# Patient Record
Sex: Male | Born: 1975 | Race: White | Hispanic: No | Marital: Married | Smoking: Former smoker
Health system: Southern US, Community
[De-identification: ages and names within clinical notes are randomized; demographics above are authoritative.]

## PROBLEM LIST (undated history)

## (undated) DIAGNOSIS — I10 Essential (primary) hypertension: Secondary | ICD-10-CM

## (undated) HISTORY — PX: MOUTH SURGERY: SHX715

## (undated) HISTORY — DX: Essential (primary) hypertension: I10

## (undated) HISTORY — PX: SKIN GRAFT: SHX250

---

## 2013-06-09 ENCOUNTER — Emergency Department (HOSPITAL_BASED_OUTPATIENT_CLINIC_OR_DEPARTMENT_OTHER)
Admission: EM | Admit: 2013-06-09 | Discharge: 2013-06-09 | Disposition: A | Discharge status: Home / Self Care | Payer: BC Managed Care – PPO | Attending: Emergency Medicine | Admitting: Emergency Medicine

## 2013-06-09 ENCOUNTER — Emergency Department (HOSPITAL_BASED_OUTPATIENT_CLINIC_OR_DEPARTMENT_OTHER): Payer: BC Managed Care – PPO

## 2013-06-09 ENCOUNTER — Encounter (HOSPITAL_BASED_OUTPATIENT_CLINIC_OR_DEPARTMENT_OTHER): Payer: Self-pay

## 2013-06-09 DIAGNOSIS — R519 Headache, unspecified: Secondary | ICD-10-CM

## 2013-06-09 DIAGNOSIS — Z87891 Personal history of nicotine dependence: Secondary | ICD-10-CM | POA: Insufficient documentation

## 2013-06-09 DIAGNOSIS — S139XXA Sprain of joints and ligaments of unspecified parts of neck, initial encounter: Secondary | ICD-10-CM | POA: Insufficient documentation

## 2013-06-09 DIAGNOSIS — Y939 Activity, unspecified: Secondary | ICD-10-CM | POA: Insufficient documentation

## 2013-06-09 DIAGNOSIS — R42 Dizziness and giddiness: Secondary | ICD-10-CM | POA: Insufficient documentation

## 2013-06-09 DIAGNOSIS — S161XXA Strain of muscle, fascia and tendon at neck level, initial encounter: Secondary | ICD-10-CM

## 2013-06-09 DIAGNOSIS — X58XXXA Exposure to other specified factors, initial encounter: Secondary | ICD-10-CM | POA: Insufficient documentation

## 2013-06-09 DIAGNOSIS — Y929 Unspecified place or not applicable: Secondary | ICD-10-CM | POA: Insufficient documentation

## 2013-06-09 DIAGNOSIS — R51 Headache: Secondary | ICD-10-CM | POA: Insufficient documentation

## 2013-06-09 MED ORDER — OXYCODONE-ACETAMINOPHEN 5-325 MG PO TABS: 2.0000 | ORAL_TABLET | ORAL | Status: DC | PRN

## 2013-06-09 NOTE — ED Provider Notes (Signed)
CSN: [id number]     Arrival date & time 06/09/13  1143 History     First MD Initiated Contact with Patient 06/09/13 1245     Chief Complaint  Patient presents with  . Neck Pain  . Headache   (Consider location/radiation/quality/duration/timing/severity/associated sxs/prior Treatment) HPI Comments: Patient presents with intermittent headache and neck pain. He states that a week ago he was writing a lower posterior and when he got off the Road history he had a left-sided headache with associated dizziness. That subsided. Since that time he had intermittent sharp pains to the left side of his head. The pain generally lasts from anywhere from 20 minutes to an hour. There is no associated dizziness nausea or vomiting. He has no vision changes. There is no numbness or weakness in his extremities. He denies any balance issues. He currently denies any headache. He states sometimes it comes on when he's doing. He also started having some pain in the left side his neck about 3 days ago. He states it's not hurting him now but it seems to hurt when he turns his head toward the left. He denies any radiation down his arm or numbness or weakness in his hand.  Patient is a 37 y.o. male presenting with neck pain and headaches.  Neck Pain Associated symptoms: headaches   Associated symptoms: no chest pain, no fever, no numbness and no weakness   Headache Associated symptoms: neck pain   Associated symptoms: no abdominal pain, no back pain, no congestion, no cough, no diarrhea, no dizziness, no fatigue, no fever, no nausea, no numbness and no vomiting     History reviewed. No pertinent past medical history. Past Surgical History  Procedure Laterality Date  . Skin graft      Injured right hand resulting in surgery and skin graft   History reviewed. No pertinent family history. History  Substance Use Topics  . Smoking status: Former Games developer  . Smokeless tobacco: Not on file  . Alcohol Use: No    Review  of Systems  Constitutional: Negative for fever, chills, diaphoresis and fatigue.  HENT: Positive for neck pain. Negative for congestion, rhinorrhea and sneezing.   Eyes: Negative.   Respiratory: Negative for cough, chest tightness and shortness of breath.   Cardiovascular: Negative for chest pain and leg swelling.  Gastrointestinal: Negative for nausea, vomiting, abdominal pain, diarrhea and blood in stool.  Genitourinary: Negative for frequency, hematuria, flank pain and difficulty urinating.  Musculoskeletal: Negative for back pain and arthralgias.  Skin: Negative for rash.  Neurological: Positive for headaches. Negative for dizziness, speech difficulty, weakness and numbness.    Allergies  Review of patient's allergies indicates no known allergies.  Home Medications  No current outpatient prescriptions on file. BP 129/76  Pulse 57  Temp(Src) 98.3 F (36.8 C) (Oral)  Resp 18  Ht 5\' 9"  (1.753 m)  Wt 180 lb (81.647 kg)  BMI 26.57 kg/m2  SpO2 98% Physical Exam  Constitutional: He is oriented to person, place, and time. He appears well-developed and well-nourished.  HENT:  Head: Normocephalic and atraumatic.  No tenderness on palpation temporal artery  Eyes: Pupils are equal, round, and reactive to light.  Neck: Normal range of motion. Neck supple.  Mild tenderness to the left paraspinal muscles in the left mid cervical region. There is no step-offs or deformities.  Cardiovascular: Normal rate, regular rhythm and normal heart sounds.   Pulmonary/Chest: Effort normal and breath sounds normal. No respiratory distress. He has no wheezes. He  has no rales. He exhibits no tenderness.  Abdominal: Soft. Bowel sounds are normal. There is no tenderness. There is no rebound and no guarding.  Musculoskeletal: Normal range of motion. He exhibits no edema.  Lymphadenopathy:    He has no cervical adenopathy.  Neurological: He is alert and oriented to person, place, and time. He has normal  strength. No cranial nerve deficit or sensory deficit. GCS eye subscore is 4. GCS verbal subscore is 5. GCS motor subscore is 6.  Skin: Skin is warm and dry. No rash noted.  Psychiatric: He has a normal mood and affect.    ED Course   Procedures (including critical care time)  Labs Reviewed - No data to display Dg Cervical Spine Complete  06/09/2013   *RADIOLOGY REPORT*  Clinical Data: Neck pain and headache  CERVICAL SPINE - COMPLETE 4+ VIEW  Comparison: None.  Findings: Frontal, lateral, open mouth odontoid, and bilateral oblique views were obtained.  There is no fracture or spondylolisthesis.  Prevertebral soft tissues and predental space regions are normal.  Disc spaces appear intact.  There is no appreciable facet arthropathy on the oblique views.  IMPRESSION: No fracture or appreciable arthropathic change.   Original Report Authenticated By: Bretta Bang, M.D.   1. Neck strain, initial encounter   2. Headache     MDM  Patient intermittent left-sided pain. He also has some tenderness to his left neck. There is no bony injury. He's neurologically intact. His gait is normal. He currently has no headache. Given that he currently has no symptoms I don't feel this is suggestive of the intracranial hemorrhage. He has no symptoms suggestive of CVA. Given the history of the posterior right I would consider carotid dissection however he currently has no symptoms. I will give him a referral to followup with neurology. I feel if his symptoms continue he may need MRI and MRA. I did not feel that he needs an urgent transfer for this today given that he doesn't have any symptoms now. Advised to return here for symptoms worsen.  Rolan Bucco, MD 06/09/13 [phone]

## 2013-06-09 NOTE — ED Notes (Addendum)
Pt states that he has had a sharp pain in his right temple for around a week now.   Pt reports the neck pain and stiffness started this morning.  Pt denies any injury but did ride roller coasters last Sat which coincides with the start of the headache.  Pt in NAD, AAOx4.

## 2014-10-23 ENCOUNTER — Emergency Department (HOSPITAL_BASED_OUTPATIENT_CLINIC_OR_DEPARTMENT_OTHER)
Admission: EM | Admit: 2014-10-23 | Discharge: 2014-10-23 | Disposition: A | Discharge status: Home / Self Care | Payer: BC Managed Care – PPO | Attending: Emergency Medicine | Admitting: Emergency Medicine

## 2014-10-23 ENCOUNTER — Encounter (HOSPITAL_BASED_OUTPATIENT_CLINIC_OR_DEPARTMENT_OTHER): Payer: Self-pay | Admitting: Family Medicine

## 2014-10-23 ENCOUNTER — Emergency Department (HOSPITAL_BASED_OUTPATIENT_CLINIC_OR_DEPARTMENT_OTHER): Payer: BC Managed Care – PPO

## 2014-10-23 DIAGNOSIS — R1110 Vomiting, unspecified: Secondary | ICD-10-CM

## 2014-10-23 DIAGNOSIS — R0981 Nasal congestion: Secondary | ICD-10-CM | POA: Diagnosis not present

## 2014-10-23 DIAGNOSIS — R51 Headache: Secondary | ICD-10-CM | POA: Diagnosis not present

## 2014-10-23 DIAGNOSIS — Z87891 Personal history of nicotine dependence: Secondary | ICD-10-CM | POA: Insufficient documentation

## 2014-10-23 DIAGNOSIS — K920 Hematemesis: Secondary | ICD-10-CM | POA: Diagnosis not present

## 2014-10-23 DIAGNOSIS — R61 Generalized hyperhidrosis: Secondary | ICD-10-CM | POA: Insufficient documentation

## 2014-10-23 DIAGNOSIS — R519 Headache, unspecified: Secondary | ICD-10-CM

## 2014-10-23 LAB — CBC WITH DIFFERENTIAL/PLATELET
BASOS ABS: 0 10*3/uL (ref 0.0–0.1)
BASOS PCT: 0 % (ref 0–1)
EOS ABS: 0.3 10*3/uL (ref 0.0–0.7)
Eosinophils Relative: 3 % (ref 0–5)
HCT: 48 % (ref 39.0–52.0)
HEMOGLOBIN: 17.7 g/dL — AB (ref 13.0–17.0)
Lymphocytes Relative: 20 % (ref 12–46)
Lymphs Abs: 1.5 10*3/uL (ref 0.7–4.0)
MCH: 34.3 pg — AB (ref 26.0–34.0)
MCHC: 36.9 g/dL — AB (ref 30.0–36.0)
MCV: 93 fL (ref 78.0–100.0)
MONO ABS: 0.7 10*3/uL (ref 0.1–1.0)
MONOS PCT: 9 % (ref 3–12)
NEUTROS ABS: 5.3 10*3/uL (ref 1.7–7.7)
NEUTROS PCT: 68 % (ref 43–77)
Platelets: 298 10*3/uL (ref 150–400)
RBC: 5.16 MIL/uL (ref 4.22–5.81)
RDW: 12.1 % (ref 11.5–15.5)
WBC: 7.7 10*3/uL (ref 4.0–10.5)

## 2014-10-23 LAB — COMPREHENSIVE METABOLIC PANEL
ALBUMIN: 4 g/dL (ref 3.5–5.2)
ALK PHOS: 99 U/L (ref 39–117)
ALT: 38 U/L (ref 0–53)
ANION GAP: 12 (ref 5–15)
AST: 29 U/L (ref 0–37)
BILIRUBIN TOTAL: 0.5 mg/dL (ref 0.3–1.2)
BUN: 13 mg/dL (ref 6–23)
CHLORIDE: 103 meq/L (ref 96–112)
CO2: 24 meq/L (ref 19–32)
CREATININE: 1 mg/dL (ref 0.50–1.35)
Calcium: 9 mg/dL (ref 8.4–10.5)
GFR calc Af Amer: >90 mL/min (ref >90)
GLUCOSE: 111 mg/dL — AB (ref 70–99)
POTASSIUM: 4.3 meq/L (ref 3.7–5.3)
Sodium: 139 mEq/L (ref 137–147)
Total Protein: 8.1 g/dL (ref 6.0–8.3)

## 2014-10-23 LAB — LIPASE, BLOOD: LIPASE: 220 U/L — AB (ref 11–59)

## 2014-10-23 MED ORDER — SODIUM CHLORIDE 0.9 % IV BOLUS (SEPSIS)
500.0000 mL | Freq: Once | INTRAVENOUS | Status: AC
  Administered 2014-10-23: 500 mL via INTRAVENOUS

## 2014-10-23 MED ORDER — PROMETHAZINE HCL 25 MG PO TABS
25.0000 mg | ORAL_TABLET | Freq: Four times a day (QID) | ORAL | Status: DC | PRN
Start: 2014-10-23 — End: 2018-08-31

## 2014-10-23 MED ORDER — HYDROCODONE-ACETAMINOPHEN 5-325 MG PO TABS
1.0000 | ORAL_TABLET | Freq: Four times a day (QID) | ORAL | Status: DC | PRN
Start: 2014-10-23 — End: 2018-08-31

## 2014-10-23 MED ORDER — IOHEXOL 300 MG/ML  SOLN
100.0000 mL | Freq: Once | INTRAMUSCULAR | Status: AC | PRN
  Administered 2014-10-23: 100 mL via INTRAVENOUS

## 2014-10-23 MED ORDER — FAMOTIDINE 20 MG PO TABS: 20.0000 mg | ORAL_TABLET | Freq: Two times a day (BID) | ORAL | Status: AC

## 2014-10-23 MED ORDER — PANTOPRAZOLE SODIUM 40 MG IV SOLR
40.0000 mg | Freq: Once | INTRAVENOUS | Status: AC
  Administered 2014-10-23: 40 mg via INTRAVENOUS
  Filled 2014-10-23: qty 40

## 2014-10-23 MED ORDER — SODIUM CHLORIDE 0.9 % IV SOLN
INTRAVENOUS | Status: DC
  Administered 2014-10-23: 09:00:00 via INTRAVENOUS

## 2014-10-23 MED ORDER — IOHEXOL 300 MG/ML  SOLN
25.0000 mL | Freq: Once | INTRAMUSCULAR | Status: AC | PRN
  Administered 2014-10-23: 25 mL via ORAL

## 2014-10-23 MED ORDER — ONDANSETRON HCL 4 MG/2ML IJ SOLN
4.0000 mg | Freq: Once | INTRAMUSCULAR | Status: AC
  Administered 2014-10-23: 4 mg via INTRAVENOUS
  Filled 2014-10-23: qty 2

## 2014-10-23 NOTE — Discharge Instructions (Signed)
Recommend clear liquid diet for the next 24 hours based on the elevated lipase. Take the Pepcid as directed for the next 2 weeks. Take the Phenergan as needed to prevent vomiting. Take the hydrocodone as needed for any headache pain. Resource guide provided below to help you find a record Dr. Bonita QuinYou will need to follow-up and have the lipase rechecked to make sure it goes back to normal.  In addition is important to return for any further vomiting of blood.    Emergency Department Resource Guide 1) Find a Doctor and Pay Out of Pocket Although you won't have to find out who is covered by your insurance plan, it is a good idea to ask around and get recommendations. You will then need to call the office and see if the doctor you have chosen will accept you as a new patient and what types of options they offer for patients who are self-pay. Some doctors offer discounts or will set up payment plans for their patients who do not have insurance, but you will need to ask so you aren't surprised when you get to your appointment.  2) Contact Your Local Health Department Not all health departments have doctors that can see patients for sick visits, but many do, so it is worth a call to see if yours does. If you don't know where your local health department is, you can check in your phone book. The CDC also has a tool to help you locate your state's health department, and many state websites also have listings of all of their local health departments.  3) Find a Walk-in Clinic If your illness is not likely to be very severe or complicated, you may want to try a walk in clinic. These are popping up all over the country in pharmacies, drugstores, and shopping centers. They're usually staffed by nurse practitioners or physician assistants that have been trained to treat common illnesses and complaints. They're usually fairly quick and inexpensive. However, if you have serious medical issues or chronic medical problems,  these are probably not your best option.  No Primary Care Doctor: - Call Health Connect at  [phone] - they can help you locate a primary care doctor that  accepts your insurance, provides certain services, etc. - Physician Referral Service- [phone]  Chronic Pain Problems: Organization         Address  Phone   Notes  [facility]  [phone] Patients need to be referred by their primary care doctor.   Medication Assistance: Organization         Address  Phone   Notes  [facility] [place] [facility] [address] [place], [place] [address] [phone] --Must be a resident of [facility] -- Must have NO insurance coverage whatsoever (no Medicaid/ Medicare, etc.) -- The pt. MUST have a primary care doctor that directs their care regularly and follows them in the community   MedAssist  [phone]   [employer]  [phone]) [phone]    Agencies that provide inexpensive medical care: Organization         Address  Phone   Notes  [facility]  [phone]) [phone]   [facility]    [phone]   [facility] [address] [place], [place] [address] [phone]   [facility] [address], [place] [phone]   [facility]anned Parenthood    [phone]  [facility]    [phone]   [facility] and [facility]  [address], [place] Phone:  [phone], Fax:  [phone] Hours of Operation:  9 am - 6 pm, M-F.  Also accepts Medicaid/Medicare and self-pay.  [facility]  [address], [place] Phone: [phone], Fax: [phone]. Hours of Operation:  8:30 am - 5:30 pm, M-F.  Also accepts Medicaid and self-pay.  [facility] [place] [address] [place] Phone: [phone]   [facility] [address] Natasha Bence [place], [place] [phone], Ext. [phone] Mondays &  Thursdays: 7-9 AM.  First 15 patients are seen on a first come, first serve basis.    Medicaid-accepting [facility] Providers:  Organization         Address  Phone   Notes  [facility] [address], [place] [phone] Also accepts self-pay patients.  [facility] [address] Laurell Josephs [place], [place]  [phone]   [facility] [address], [place] [phone]   [facility] [address], [place] [phone]   Renaye Rakers [address], [place]   [phone] Only accepts [place] Access [place] patients after they have their name applied to their card.   Self-Pay (no insurance) in [place]:  Organization         Address  Phone   Notes  Sickle Cell Patients, [facility] [address] [place], [place] [phone]   [facility] [address] [place], [place] [phone]   [facility]  [address], [place] [phone]   [facility]/Dr. Osei-Bonsu  [address] [place] or [address], [place] [phone] Phone number for both [place] and [place] locations is the same.  Urgent Medical and [facility] [address] [place] [phone]   [facility] [address], [place] or [address] [phone]   [facility] [address], [place] [phone], phone; [phone], fax Sees patients 1st and 3rd Saturday of every month.  Must not qualify for public or private insurance (i.e. Medicaid, Medicare, [place] Health Choice, Veterans' Benefits)  Household income should be no more than 200% of the poverty level The clinic cannot treat you if you are pregnant or think you are pregnant  Sexually transmitted diseases are not treated at the  clinic.    Dental Care: Organization         Address  Phone  Notes  [facility] Department of [facility] [address] [place], [place] [phone] Accepts children up to age 64 who are enrolled in [place] or [place] Health Choice; pregnant women with a Medicaid card; and children who have applied for Medicaid or [place] Health Choice, but were declined, whose parents can pay a reduced fee at time of service.  [facility] Department of [facility]  [address], [place] [phone] Accepts children up to age 89 who are enrolled in [place] or [place] Health Choice; pregnant women with a Medicaid card; and children who have applied for Medicaid or [place] Health Choice, but were declined, whose parents can pay a reduced fee at time  of service.  [facility]  [address], [place] [phone] Patients are seen by appointment only. Walk-ins are not accepted. [facility] will see patients 10518 years of age and older. Monday - Tuesday (8am-5pm) Most Wednesdays (8:30-5pm) $30 per visit, cash only  [facility]  [address], [facility] [phone] Patients are seen by appointment only. Walk-ins are not accepted. [facility] will see patients 40 years of age and older. One Wednesday Evening (Monthly: Volunteer Based).  $30 per visit, cash only  [employer] [facility] [facility]  [phone] for adults; Children under age 614, call Graduate Pediatric Dentistry at [phone]. Children aged 634-14, please call [phone]) [phone] to request a pediatric application.  Dental services are provided in all areas of dental care including fillings, crowns and bridges, complete and partial dentures, implants, gum treatment, root canals, and extractions. Preventive care is also provided. Treatment is provided to both adults and children. Patients are selected via a lottery and there is often a  waiting list.   [facility] [address], [place]  [phone] [email or web address]   [facility] [address], [place], [place] [phone], Ext. [phone] Second and Fourth Thursday of each month, opens at 6:30 AM; Clinic ends at 9 AM.  Patients are seen on a first-come first-served basis, and a limited number are seen during each clinic.   [facility]  [address], [place], [place] [phone]   Eligibility Requirements You must have lived in [place], [place], or [place] counties for at least the last three months.   You cannot be eligible for state or federal sponsored National Cityhealthcare insurance, including CIGNAVeterans Administration, [place]icaid, or [employer]Medicare.   You generally cannot be eligible for healthcare insurance through your employer.    How to apply: Eligibility screenings are held every Tuesday and Wednesday afternoon from 1:00 pm until 4:00 pm. You do not need an appointment for the interview!  [facility] [address], [place], [place] [phone]   [facility]  [phone]   [facility]  [phone]   [facility]  [phone]    Behavioral Health Resources in the Community: Intensive Outpatient Programs Organization         Address  Phone  Notes  [facility] [address], [place], [place] [phone]   [facility] [address], [place], [place] [phone]   [facility] [address], [place], [place]  [phone]   [facility] [address],  [place], [place] [phone] or [phone]   Substance Abuse Resources Organization         Address  Phone  Notes  Al[facility] Drug Services  [phone]   [facility]  [phone]   [facility]  [phone]   [facility]  [phone]   Residential & Outpatient Substance Abuse  Program  [phone]   Psychological Services Organization         Address  Phone  Notes  [facility]  [phone]   [facility]  [phone]- [phone]   [facility] [address], [place] [phone] or [phone]    Mobile Crisis Teams Organization         Address  Phone  Notes  [facility], Mobile Crisis Care Unit  [phone]   Assertive Psychotherapeutic  Services  [address] [place], [place]   [facility] [address], [place] [phone]    Self-Help/Support Groups Organization         Address  Phone             Notes  [place]. of [place] - variety of support groups  [place] Call for more information  Narcotics Anonymous (NA), [facility]aring [facility] [address], [employer] [place]  2 meetings at this location   Special educational needs teacher         Address  Phone  Notes  ASAP Residential Treatment [place],    [place]  [phone]   [facility]  [address] [place] [address]48016, [place], [place]   [place], [place] [phone] Admissions: 8am-3pm M-F  Incentives Substance [place] [address]    [place], [place] [phone]   [facility] [address] [place], [place], [place]   [facility] [address],  [place], [place]   [facility] Programs - Intensive Outpatient [address] Kristeen Mans [address], [place], [place]   [facility].,  [place], [place] [phone] or [phone]   [facility] (RTS) [address] [place], [place] Accepts Medicaid  [facility] [place] [address],  [place] [phone] Substance Abuse/Addiction Treatment   [facility] Organization          Address  Phone  Notes  [facility]  [phone]   Domenic Schwab, PhD [address] Arlis [place], [place]   [phone] or [phone]   [place] [address] [place], [place] [phone]   [facility] [address], [place], [place] [phone] Insurance/Medicaid/sponsorship through [facility] [address] [place]                                    [place], [place] [phone] [place] [address], [place] [phone]    Dr. Adele Schilder  [phone]   [facility]. 1) [address] [place] 2) [place] 3)  [place] [address], [place] [phone]  [phone]   [place] [phone] or [phone] (After Hours)

## 2014-10-23 NOTE — ED Provider Notes (Addendum)
CSN: [id number]     Arrival date & time 10/23/14  [id number] History   First MD Initiated Contact with Patient 10/23/14 [phone]     Chief Complaint  Patient presents with  . Facial Pain  . Nasal Congestion     (Consider location/radiation/quality/duration/timing/severity/associated sxs/prior Treatment) The history is provided by the patient.   patient presents with 2 complaints. Patient's had some long-standing intermittent headaches for the past several months that he felt were related to sinus infections. Patient's been taking Alka-Seltzer plus on a regular basis. Today about 20 minutes after taking the Alka-Seltzer he vomited 2 pools of blood.  Patient denies any lightheadedness or feeling like she's got pass out chest pain or shortness of breath. Does admit to pressure in the face. History reviewed. No pertinent past medical history. Past Surgical History  Procedure Laterality Date  . Skin graft      Injured right hand resulting in surgery and skin graft   No family history on file. History  Substance Use Topics  . Smoking status: Former Games developermoker  . Smokeless tobacco: Not on file  . Alcohol Use: No    Review of Systems  Constitutional: Positive for fever and diaphoresis.  HENT: Positive for congestion and sinus pressure.   Eyes: Negative for visual disturbance.  Respiratory: Negative for shortness of breath.   Cardiovascular: Negative for chest pain.  Gastrointestinal: Positive for nausea and vomiting. Negative for abdominal pain and blood in stool.  Genitourinary: Negative for dysuria.  Musculoskeletal: Negative for back pain.  Skin: Negative for rash.  Neurological: Negative for headaches.  Hematological: Does not bruise/bleed easily.  Psychiatric/Behavioral: Negative for confusion.      Allergies  Review of patient's allergies indicates no known allergies.  Home Medications   Prior to Admission medications   Medication Sig Start Date End Date Taking? Authorizing  Provider  oxyCODONE-acetaminophen (PERCOCET) 5-325 MG per tablet Take 2 tablets by mouth every 4 (four) hours as needed for pain. 06/09/13   Rolan BuccoMelanie Belfi, MD   BP 131/72 mmHg  Pulse 68  Temp(Src) 98.6 F (37 C) (Oral)  Resp 16  Ht 5\' 9"  (1.753 m)  Wt 195 lb (88.451 kg)  BMI 28.78 kg/m2  SpO2 97% Physical Exam  Constitutional: He is oriented to person, place, and time. He appears well-developed and well-nourished. No distress.  HENT:  Head: Normocephalic and atraumatic.  Mouth/Throat: Oropharynx is clear and moist. No oropharyngeal exudate.  Eyes: Conjunctivae and EOM are normal. Pupils are equal, round, and reactive to light.  Neck: Normal range of motion.  Cardiovascular: Normal rate, regular rhythm and normal heart sounds.   No murmur heard. Pulmonary/Chest: Effort normal and breath sounds normal. No respiratory distress.  Abdominal: Soft. Bowel sounds are normal. He exhibits no distension.  Musculoskeletal: Normal range of motion.  Neurological: He is alert and oriented to person, place, and time. No cranial nerve deficit. He exhibits normal muscle tone. Coordination normal.  Skin: Skin is warm. No rash noted.  Nursing note and vitals reviewed.   ED Course  Procedures (including critical care time) Labs Review Labs Reviewed  COMPREHENSIVE METABOLIC PANEL - Abnormal; Notable for the following:    Glucose, Bld 111 (*)    All other components within normal limits  LIPASE, BLOOD - Abnormal; Notable for the following:    Lipase 220 (*)    All other components within normal limits  CBC WITH DIFFERENTIAL - Abnormal; Notable for the following:    Hemoglobin 17.7 (*)  MCH 34.3 (*)    MCHC 36.9 (*)    All other components within normal limits   Results for orders placed or performed during the hospital encounter of 10/23/14  Comprehensive metabolic panel  Result Value Ref Range   Sodium 139 137 - 147 mEq/L   Potassium 4.3 3.7 - 5.3 mEq/L   Chloride 103 96 - 112 mEq/L    CO2 24 19 - 32 mEq/L   Glucose, Bld 111 (H) 70 - 99 mg/dL   BUN 13 6 - 23 mg/dL   Creatinine, Ser 1.61 0.50 - 1.35 mg/dL   Calcium 9.0 8.4 - 09.6 mg/dL   Total Protein 8.1 6.0 - 8.3 g/dL   Albumin 4.0 3.5 - 5.2 g/dL   AST 29 0 - 37 U/L   ALT 38 0 - 53 U/L   Alkaline Phosphatase 99 39 - 117 U/L   Total Bilirubin 0.5 0.3 - 1.2 mg/dL   GFR calc non Af Amer >90 >90 mL/min   GFR calc Af Amer >90 >90 mL/min   Anion gap 12 5 - 15  Lipase, blood  Result Value Ref Range   Lipase 220 (H) 11 - 59 U/L  CBC with Differential  Result Value Ref Range   WBC 7.7 4.0 - 10.5 K/uL   RBC 5.16 4.22 - 5.81 MIL/uL   Hemoglobin 17.7 (H) 13.0 - 17.0 g/dL   HCT 04.5 40.9 - 81.1 %   MCV 93.0 78.0 - 100.0 fL   MCH 34.3 (H) 26.0 - 34.0 pg   MCHC 36.9 (H) 30.0 - 36.0 g/dL   RDW 91.4 78.2 - 95.6 %   Platelets 298 150 - 400 K/uL   Neutrophils Relative % 68 43 - 77 %   Neutro Abs 5.3 1.7 - 7.7 K/uL   Lymphocytes Relative 20 12 - 46 %   Lymphs Abs 1.5 0.7 - 4.0 K/uL   Monocytes Relative 9 3 - 12 %   Monocytes Absolute 0.7 0.1 - 1.0 K/uL   Eosinophils Relative 3 0 - 5 %   Eosinophils Absolute 0.3 0.0 - 0.7 K/uL   Basophils Relative 0 0 - 1 %   Basophils Absolute 0.0 0.0 - 0.1 K/uL    Imaging Review Ct Head Wo Contrast  10/23/2014   CLINICAL DATA:  Headache, sinus pressure  EXAM: CT HEAD WITHOUT CONTRAST  TECHNIQUE: Contiguous axial images were obtained from the base of the skull through the vertex without intravenous contrast.  COMPARISON:  None.  FINDINGS: No skull fracture is noted. Paranasal sinuses and mastoid air cells are unremarkable.  No intracranial hemorrhage, mass effect or midline shift. No hydrocephalus. The gray and white-matter differentiation is preserved. No intra or extra-axial fluid collection. No acute infarction. No mass lesion is noted on this unenhanced scan.  IMPRESSION: No acute intracranial abnormality.   Electronically Signed   By: Natasha Mead M.D.   On: 10/23/2014 08:12     EKG  Interpretation None      MDM   Final diagnoses:  Headache  Vomiting    Patient presents with 2 concerns. One is intermittent headaches that he believes are related to sinus infections. Has been taking Alka-Seltzer plus frequently. And today vomited 2 pools of blood.  CT of the head shows no intercranial abnormalities. Also no evidence of any sinus infection. Labs showed no significant anemia or liver function test abnormalities with the exception of lipase which is significantly elevated at 220. No leukocytosis. Based on the elevated lipase even  though minimal symptoms other than the vomiting proceed with CT abdomen pelvis.  Vanetta MuldersScott Paizlee Kinder, MD 10/23/14 [id number]  Vanetta MuldersScott George Alcantar, MD 10/23/14 0900   Results for orders placed or performed during the hospital encounter of 10/23/14  Comprehensive metabolic panel  Result Value Ref Range   Sodium 139 137 - 147 mEq/L   Potassium 4.3 3.7 - 5.3 mEq/L   Chloride 103 96 - 112 mEq/L   CO2 24 19 - 32 mEq/L   Glucose, Bld 111 (H) 70 - 99 mg/dL   BUN 13 6 - 23 mg/dL   Creatinine, Ser 9.601.00 0.50 - 1.35 mg/dL   Calcium 9.0 8.4 - 45.410.5 mg/dL   Total Protein 8.1 6.0 - 8.3 g/dL   Albumin 4.0 3.5 - 5.2 g/dL   AST 29 0 - 37 U/L   ALT 38 0 - 53 U/L   Alkaline Phosphatase 99 39 - 117 U/L   Total Bilirubin 0.5 0.3 - 1.2 mg/dL   GFR calc non Af Amer >90 >90 mL/min   GFR calc Af Amer >90 >90 mL/min   Anion gap 12 5 - 15  Lipase, blood  Result Value Ref Range   Lipase 220 (H) 11 - 59 U/L  CBC with Differential  Result Value Ref Range   WBC 7.7 4.0 - 10.5 K/uL   RBC 5.16 4.22 - 5.81 MIL/uL   Hemoglobin 17.7 (H) 13.0 - 17.0 g/dL   HCT 09.848.0 11.939.0 - 14.752.0 %   MCV 93.0 78.0 - 100.0 fL   MCH 34.3 (H) 26.0 - 34.0 pg   MCHC 36.9 (H) 30.0 - 36.0 g/dL   RDW 82.912.1 56.211.5 - 13.015.5 %   Platelets 298 150 - 400 K/uL   Neutrophils Relative % 68 43 - 77 %   Neutro Abs 5.3 1.7 - 7.7 K/uL   Lymphocytes Relative 20 12 - 46 %   Lymphs Abs 1.5 0.7 - 4.0 K/uL    Monocytes Relative 9 3 - 12 %   Monocytes Absolute 0.7 0.1 - 1.0 K/uL   Eosinophils Relative 3 0 - 5 %   Eosinophils Absolute 0.3 0.0 - 0.7 K/uL   Basophils Relative 0 0 - 1 %   Basophils Absolute 0.0 0.0 - 0.1 K/uL   Ct Head Wo Contrast  10/23/2014   CLINICAL DATA:  Headache, sinus pressure  EXAM: CT HEAD WITHOUT CONTRAST  TECHNIQUE: Contiguous axial images were obtained from the base of the skull through the vertex without intravenous contrast.  COMPARISON:  None.  FINDINGS: No skull fracture is noted. Paranasal sinuses and mastoid air cells are unremarkable.  No intracranial hemorrhage, mass effect or midline shift. No hydrocephalus. The gray and white-matter differentiation is preserved. No intra or extra-axial fluid collection. No acute infarction. No mass lesion is noted on this unenhanced scan.  IMPRESSION: No acute intracranial abnormality.   Electronically Signed   By: Natasha MeadLiviu  Pop M.D.   On: 10/23/2014 08:12   Ct Abdomen Pelvis W Contrast  10/23/2014   CLINICAL DATA:  Vomiting blood twice today , lower abdominal pain  EXAM: CT ABDOMEN AND PELVIS WITH CONTRAST  TECHNIQUE: Multidetector CT imaging of the abdomen and pelvis was performed using the standard protocol following bolus administration of intravenous contrast.  CONTRAST:  25mL OMNIPAQUE IOHEXOL 300 MG/ML SOLN, 100mL OMNIPAQUE IOHEXOL 300 MG/ML SOLN  COMPARISON:  None.  FINDINGS: Sagittal images of the spine shows mild degenerative changes lower thoracic spine. Lung bases are unremarkable. Mild hepatic fatty infiltration. Probable cyst in right  hepatic lobe measures 1 cm.  No calcified gallstones are noted within gallbladder. No focal hepatic mass. The pancreas, spleen and adrenal glands are unremarkable. Kidneys are symmetrical in size and enhancement. No hydronephrosis or hydroureter.  Delayed renal images shows bilateral renal symmetrical excretion. Bilateral visualized proximal ureter is unremarkable.  There is no aortic aneurysm.  No  small bowel obstruction. No thickened or dilated small bowel loops. No ascites or free air. No adenopathy.  Normal appendix clearly visualized axial image 55. No pericecal inflammation. The terminal ileum is unremarkable. Some oral contrast material noted within cecum.  No distal colonic obstruction. Few diverticula are noted proximal sigmoid colon without evidence of acute diverticulitis. Some gas and stool noted in distal sigmoid colon. The rectum is empty. Prostate gland and seminal vesicles are unremarkable. No destructive bony lesions are noted within pelvis. No inguinal adenopathy.  Mild disc space flattening with minimal posterior spurring at L5-S1 level.  IMPRESSION: 1. No acute inflammatory process within abdomen or pelvis. 2. There is mild hepatic fatty infiltration. 3. Normal retrocecal appendix.  No pericecal inflammation. 4. No small bowel obstruction. 5. No hydronephrosis or hydroureter. Bilateral renal symmetrical excretion. 6. Few diverticula proximal sigmoid colon without evidence of acute diverticulitis.   Electronically Signed   By: Natasha Mead M.D.   On: 10/23/2014 10:10    Patient's CT scan of the abdomen without any significant findings. Not able to completely explain the elevated lipase. No significant epigastric abdominal pain. No evidence pancreatic inflammation on the CT scan. Patient with 2 episodes of vomiting blood none since, hematocrit stable. Vital signs are stable. Will treat with Pepcid Phenergan to prevent the nausea hydrocodone as needed for the headaches. In addition no evidence of any sinus infections. No/to headaches. Patient given resource guide to find a regular Dr. for follow-up to make sure the lipase returned back to normal. Work note provided.  Vanetta Mulders, MD 10/23/14 [phone]

## 2014-10-23 NOTE — ED Notes (Signed)
Pt c/o intermittent headaches and nasal congestion/drainage x weeks. Pt sts he coughed up blood this morning and had "hot flashes" last night. Denies fever/chills, n/v/d.

## 2016-08-03 ENCOUNTER — Ambulatory Visit (INDEPENDENT_AMBULATORY_CARE_PROVIDER_SITE_OTHER): Payer: Self-pay

## 2016-08-03 ENCOUNTER — Other Ambulatory Visit: Payer: Self-pay | Admitting: Emergency Medicine

## 2016-08-03 DIAGNOSIS — Z021 Encounter for pre-employment examination: Secondary | ICD-10-CM

## 2016-12-06 ENCOUNTER — Encounter (HOSPITAL_BASED_OUTPATIENT_CLINIC_OR_DEPARTMENT_OTHER): Payer: Self-pay | Admitting: *Deleted

## 2016-12-06 ENCOUNTER — Emergency Department (HOSPITAL_BASED_OUTPATIENT_CLINIC_OR_DEPARTMENT_OTHER)
Admission: EM | Admit: 2016-12-06 | Discharge: 2016-12-06 | Disposition: A | Discharge status: Home / Self Care | Payer: Self-pay | Attending: Emergency Medicine | Admitting: Emergency Medicine

## 2016-12-06 DIAGNOSIS — K0889 Other specified disorders of teeth and supporting structures: Secondary | ICD-10-CM

## 2016-12-06 DIAGNOSIS — Z791 Long term (current) use of non-steroidal anti-inflammatories (NSAID): Secondary | ICD-10-CM | POA: Insufficient documentation

## 2016-12-06 DIAGNOSIS — Z87891 Personal history of nicotine dependence: Secondary | ICD-10-CM | POA: Insufficient documentation

## 2016-12-06 MED ORDER — TRAMADOL HCL 50 MG PO TABS: 50.0000 mg | ORAL_TABLET | Freq: Four times a day (QID) | ORAL | 0 refills | Status: DC | PRN

## 2016-12-06 MED ORDER — AMOXICILLIN 500 MG PO CAPS: 500.0000 mg | ORAL_CAPSULE | Freq: Two times a day (BID) | ORAL | 0 refills | Status: DC

## 2016-12-06 NOTE — Discharge Instructions (Signed)
Please read attached information. If you experience any new or worsening signs or symptoms please return to the emergency room for evaluation. Please follow-up with your primary care provider or specialist as discussed. Please use medication prescribed only as directed and discontinue taking if you have any concerning signs or symptoms.   °

## 2016-12-06 NOTE — ED Triage Notes (Signed)
Patient states he had a broken abscessed tooth on the right upper.  Has had infection in the tooth for approximately one week.  Now has swelling on bilateral face.

## 2016-12-06 NOTE — ED Provider Notes (Signed)
[facility] Provider Note   CSN: [id number] Arrival date & time: 12/06/16  1359   By signing my name below, I, [name], attest that this documentation has been prepared under the direction and in the presence of [employer], PA-C. Electronically Signed: [name], Scribe. 12/06/16. 5:13 PM.   History   Chief Complaint Chief Complaint  Patient presents with  . Dental Pain   The history is provided by the patient and medical records. No language interpreter was used.    HPI Comments: Jerome Williams is a 41 y.o. male who presents to the Emergency Department complaining of bilateral upper dental pain. He describes the pain the pain as stabbing and shooting, radiating up into the cheeks. He also reports swelling in the surrounding gums. Baseline rhinorrhea noted in the mornings. He states he takes ibuprofen at home for pain. Pt denies fever, cough, congestion or rhinorrhea significant from baseline.  History reviewed. No pertinent past medical history.  There are no active problems to display for this patient.   Past Surgical History:  Procedure Laterality Date  . MOUTH SURGERY     x 2  . SKIN GRAFT     Injured right hand resulting in surgery and skin graft       Home Medications    Prior to Admission medications   Medication Sig Start Date End Date Taking? Authorizing Provider  ibuprofen (ADVIL,MOTRIN) 800 MG tablet Take 800 mg by mouth every 8 (eight) hours as needed.   Yes Historical Provider, MD  amoxicillin (AMOXIL) 500 MG capsule Take 1 capsule (500 mg total) by mouth 2 (two) times daily. 12/06/16   Eyvonne Mechanic, PA-C  famotidine (PEPCID) 20 MG tablet Take 1 tablet (20 mg total) by mouth 2 (two) times daily. 10/23/14   Vanetta Mulders, MD  HYDROcodone-acetaminophen (NORCO/VICODIN) 5-325 MG per tablet Take 1-2 tablets by mouth every 6 (six) hours as needed for moderate pain. 10/23/14   Vanetta Mulders, MD  oxyCODONE-acetaminophen (PERCOCET)  5-325 MG per tablet Take 2 tablets by mouth every 4 (four) hours as needed for pain. 06/09/13   Rolan Bucco, MD  promethazine (PHENERGAN) 25 MG tablet Take 1 tablet (25 mg total) by mouth every 6 (six) hours as needed for nausea or vomiting. 10/23/14   Vanetta Mulders, MD  traMADol (ULTRAM) 50 MG tablet Take 1 tablet (50 mg total) by mouth every 6 (six) hours as needed. 12/06/16   Eyvonne Mechanic, PA-C    Family History No family history on file.  Social History Social History  Substance Use Topics  . Smoking status: Former Games developer  . Smokeless tobacco: Not on file  . Alcohol use No     Allergies   Patient has no known allergies.   Review of Systems Review of Systems  All other systems reviewed and are negative. A complete 10 system review of systems was obtained and all systems are negative except as noted in the HPI and PMH.     Physical Exam Updated Vital Signs BP 112/84 (BP Location: Left Arm)   Pulse 60   Temp 98.7 F (37.1 C) (Oral)   Resp 18   Ht 5\' 9"  (1.753 m)   Wt 210 lb (95.3 kg)   SpO2 100%   BMI 31.01 kg/m   Physical Exam  Constitutional: He is oriented to person, place, and time. He appears well-developed and well-nourished. No distress.  HENT:  Head: Normocephalic.  Nose: No mucosal edema, rhinorrhea, nasal deformity, septal deviation or nasal  septal hematoma. No epistaxis. Right sinus exhibits maxillary sinus tenderness. Right sinus exhibits no frontal sinus tenderness. Left sinus exhibits maxillary sinus tenderness. Left sinus exhibits no frontal sinus tenderness.  Mouth/Throat: Uvula is midline, oropharynx is clear and moist and mucous membranes are normal. No oropharyngeal exudate, posterior oropharyngeal edema, posterior oropharyngeal erythema or tonsillar abscesses.  External exam shows no asymmetry of the jaw line or face, no signs of obvious swelling, edema, infection. Full active range of motion of the jaw. Neck is supple with full active range of  motion, no tenderness to palpation of the soft tissues  Numerous dental caries and extracted teeth. Patient missing majority of his left-sided maxillary dentition  Gumline palpated no obvious signs of infection including warmth, redness, abscess, tenderness. Posterior oropharynx clear with no signs of infection, uvula is midline and rises with phonation, tongue is normal soft touch with full active range of motion, floor mouth is soft nontender.  Eyes: Conjunctivae are normal. Pupils are equal, round, and reactive to light. Right eye exhibits no discharge. Left eye exhibits no discharge.  Neck: Normal range of motion. Neck supple. No JVD present. No tracheal deviation present. No thyromegaly present.  Pulmonary/Chest: No stridor.  Lymphadenopathy:    He has no cervical adenopathy.  Neurological: He is alert and oriented to person, place, and time.  Skin: Skin is warm and dry. No rash noted. He is not diaphoretic. No erythema. No pallor.  Psychiatric: He has a normal mood and affect. His behavior is normal. Judgment and thought content normal.  Nursing note and vitals reviewed.    ED Treatments / Results  DIAGNOSTIC STUDIES: Oxygen Saturation is 100% on RA, normal by my interpretation.    COORDINATION OF CARE: 5:12 PM Discussed treatment plan with pt at bedside and pt agreed to plan. Will order Rx for ultram and abx. Pt advised to continue pain management with ibuprofen and prescribed medications and F/U with a dentist. Pt also advised to return to the [facility] ED if symptoms persist or worsen.  Labs (all labs ordered are listed, but only abnormal results are displayed) Labs Reviewed - No data to display  EKG  EKG Interpretation None       Radiology No results found.  Procedures Procedures (including critical care time)  Medications Ordered in ED Medications - No data to display   Initial Impression / Assessment and Plan / ED Course  I have reviewed the triage vital signs and  the nursing notes.  Pertinent labs & imaging results that were available during my care of the patient were reviewed by me and considered in my medical decision making (see chart for details).     41 year old male presents today with complaints of dental pain. Patient has no obvious signs of infection on exam. He has no swelling to the gumline, fever, rhinorrhea concerning signs or symptoms. Patient also displaying bilateral maxillary tenderness without signs of infection. Uncertain etiology as patient does not have dentition on the left maxillary side that would indicate obvious source for dental infection, again it would be unlikely for him to have bilateral axillary dental infections. Patient does not have any signs or symptoms consistent with sinusitis here. He'll be started on amoxicillin to cover for sinus related symptoms/dental infection. Patient is encouraged to follow-up with dental resources for reevaluation further management. He is given strict return precautions. Verbalizes understanding and agreement to this plan.   Final Clinical Impressions(s) / ED Diagnoses   Final diagnoses:  Pain, dental  New Prescriptions Discharge Medication List as of 12/06/2016  5:23 PM    START taking these medications   Details  amoxicillin (AMOXIL) 500 MG capsule Take 1 capsule (500 mg total) by mouth 2 (two) times daily., Starting Sun 12/06/2016, Print         Eyvonne MechanicJeffrey Andy Moye, PA-C 12/06/16 [id number]    Alvira MondayErin Schlossman, MD 12/08/16 [id number]

## 2017-08-18 ENCOUNTER — Ambulatory Visit (INDEPENDENT_AMBULATORY_CARE_PROVIDER_SITE_OTHER): Payer: Self-pay

## 2017-08-18 ENCOUNTER — Other Ambulatory Visit: Payer: Self-pay | Admitting: Gerontology

## 2017-08-18 DIAGNOSIS — Z0000 Encounter for general adult medical examination without abnormal findings: Secondary | ICD-10-CM

## 2018-06-11 ENCOUNTER — Emergency Department (HOSPITAL_BASED_OUTPATIENT_CLINIC_OR_DEPARTMENT_OTHER)
Admission: EM | Admit: 2018-06-11 | Discharge: 2018-06-11 | Disposition: A | Discharge status: Home / Self Care | Payer: Self-pay | Attending: Emergency Medicine | Admitting: Emergency Medicine

## 2018-06-11 ENCOUNTER — Encounter (HOSPITAL_BASED_OUTPATIENT_CLINIC_OR_DEPARTMENT_OTHER): Payer: Self-pay

## 2018-06-11 ENCOUNTER — Other Ambulatory Visit: Payer: Self-pay

## 2018-06-11 ENCOUNTER — Emergency Department (HOSPITAL_BASED_OUTPATIENT_CLINIC_OR_DEPARTMENT_OTHER): Payer: Self-pay

## 2018-06-11 DIAGNOSIS — F17290 Nicotine dependence, other tobacco product, uncomplicated: Secondary | ICD-10-CM | POA: Insufficient documentation

## 2018-06-11 DIAGNOSIS — R51 Headache: Secondary | ICD-10-CM

## 2018-06-11 DIAGNOSIS — R519 Headache, unspecified: Secondary | ICD-10-CM

## 2018-06-11 DIAGNOSIS — H539 Unspecified visual disturbance: Secondary | ICD-10-CM | POA: Insufficient documentation

## 2018-06-11 DIAGNOSIS — Z79899 Other long term (current) drug therapy: Secondary | ICD-10-CM | POA: Insufficient documentation

## 2018-06-11 MED ORDER — FLUORESCEIN SODIUM 1 MG OP STRP
1.0000 | ORAL_STRIP | Freq: Once | OPHTHALMIC | Status: AC
  Administered 2018-06-11: 1 via OPHTHALMIC
  Filled 2018-06-11: qty 1

## 2018-06-11 MED ORDER — TETRACAINE HCL 0.5 % OP SOLN
2.0000 [drp] | Freq: Once | OPHTHALMIC | Status: AC
  Administered 2018-06-11: 2 [drp] via OPHTHALMIC
  Filled 2018-06-11: qty 4

## 2018-06-11 NOTE — ED Notes (Signed)
ED Provider at bedside. 

## 2018-06-11 NOTE — Discharge Instructions (Signed)
As we discussed, follow-up with your neurologist office for evaluation of her frequent headaches.  Low up with referred eye doctor to get an eye exam.  Return the emergency department for any worsening headache, vision changes, difficulty walking, numbness/weakness of the arms legs or any other worsening or concerning symptoms.

## 2018-06-11 NOTE — ED Provider Notes (Signed)
[facility] EMERGENCY DEPARTMENT Provider Note   CSN: [id number] Arrival date & time: 06/11/18  1304     History   Chief Complaint Chief Complaint  Patient presents with  . Eye Problem    HPI Jerome Williams is a 42 y.o. male with no significant past medical history who presents for evaluation of visual disturbance that occurred yesterday.  Patient reports that he was sitting at work when he started seeing a rainbow colored Diamond in his left eye.  He states that it took up his entire field of vision.  He states he did not have the symptom in his right eye.  Patient states that the episode lasted for about 30 minutes before resolving.  He does endorse a headache at that time.  He states that the headache lasted for about 4 hours.  He took ibuprofen and eventually resolved on its own.  He does state that he has been having some intermittent headaches for the last month.  He has not gotten this evaluated.  He denies any preceding trauma, injury to his head or thigh.  He states he does not wear glasses or contacts.  He does report that over the last several months, he has noticed he has had to focus more when attempting to read things.  He has not had an eye exam.  Patient denies any difficulty walking or numbness/weakness of his arms or legs.  He states that since the episode yesterday, he has not had any more episodes.  Patient states he is asymptomatic currently.  Patient denies any recent fever.   The history is provided by the patient.    History reviewed. No pertinent past medical history.  There are no active problems to display for this patient.   Past Surgical History:  Procedure Laterality Date  . MOUTH SURGERY     x 2  . SKIN GRAFT     Injured right hand resulting in surgery and skin graft        Home Medications    Prior to Admission medications   Medication Sig Start Date End Date Taking? Authorizing Provider  amoxicillin (AMOXIL) 500 MG capsule Take 1  capsule (500 mg total) by mouth 2 (two) times daily. 12/06/16   Hedges, Tinnie GensJeffrey, PA-C  famotidine (PEPCID) 20 MG tablet Take 1 tablet (20 mg total) by mouth 2 (two) times daily. 10/23/14   Vanetta MuldersZackowski, Scott, MD  HYDROcodone-acetaminophen (NORCO/VICODIN) 5-325 MG per tablet Take 1-2 tablets by mouth every 6 (six) hours as needed for moderate pain. 10/23/14   Vanetta MuldersZackowski, Scott, MD  ibuprofen (ADVIL,MOTRIN) 800 MG tablet Take 800 mg by mouth every 8 (eight) hours as needed.    [provider]  oxyCODONE-acetaminophen (PERCOCET) 5-325 MG per tablet Take 2 tablets by mouth every 4 (four) hours as needed for pain. 06/09/13   Rolan BuccoBelfi, Melanie, MD  promethazine (PHENERGAN) 25 MG tablet Take 1 tablet (25 mg total) by mouth every 6 (six) hours as needed for nausea or vomiting. 10/23/14   Vanetta MuldersZackowski, Scott, MD  traMADol (ULTRAM) 50 MG tablet Take 1 tablet (50 mg total) by mouth every 6 (six) hours as needed. 12/06/16   Eyvonne MechanicHedges, Jeffrey, PA-C    Family History No family history on file.  Social History Social History   Tobacco Use  . Smoking status: Current Every Day Smoker    Types: Cigars  . Smokeless tobacco: Never Used  Substance Use Topics  . Alcohol use: Yes    Comment: occasion  .  Drug use: No     Allergies   Patient has no known allergies.   Review of Systems Review of Systems  Constitutional: Negative for fever and unexpected weight change.  Eyes: Positive for visual disturbance. Negative for photophobia and redness.  Neurological: Positive for headaches. Negative for weakness and numbness.  All other systems reviewed and are negative.    Physical Exam Updated Vital Signs BP 111/73 (BP Location: Left Arm)   Pulse (!) 54   Temp 98.2 F (36.8 C) (Oral)   Resp 18   Ht 5\' 9"  (1.753 m)   Wt 95.3 kg (210 lb)   SpO2 100%   BMI 31.01 kg/m   Physical Exam  Constitutional: He appears well-developed and well-nourished.  HENT:  Head: Normocephalic and atraumatic.  Eyes: Pupils  are equal, round, and reactive to light. Conjunctivae and EOM are normal. Right eye exhibits no discharge. Left eye exhibits no discharge. No scleral icterus.  Fundoscopic exam:      The right eye shows no hemorrhage.       The left eye shows no hemorrhage.  EOMs intact without difficulty.  PERRLA.  Pulmonary/Chest: Effort normal.  Neurological: He is alert.  Cranial nerves III-XII intact Follows commands, Moves all extremities  5/5 strength to BUE and BLE  Sensation intact throughout all major nerve distributions Normal finger to nose. No dysdiadochokinesia. No pronator drift. No gait abnormalities  No slurred speech. No facial droop.   Skin: Skin is warm and dry.  Psychiatric: He has a normal mood and affect. His speech is normal and behavior is normal.  Nursing note and vitals reviewed.    ED Treatments / Results  Labs (all labs ordered are listed, but only abnormal results are displayed) Labs Reviewed - No data to display  EKG None  Radiology Ct Head Wo Contrast  Result Date: 06/11/2018 CLINICAL DATA:  Pt c/o seeing "diamonds" that were "rainbow colors" out of his L eye yesterday. Pt is not seeing spots today. Pt also reports HA x 2 weeks Denies n/v, denies numbness or tingling in arms or legs, no dizziness, states he could not walk during visual disturbances. EXAM: CT HEAD WITHOUT CONTRAST TECHNIQUE: Contiguous axial images were obtained from the base of the skull through the vertex without intravenous contrast. COMPARISON:  CT of the head on 10/23/2014 FINDINGS: Brain: No evidence of acute infarction, hemorrhage, hydrocephalus, extra-axial collection or mass lesion/mass effect. Vascular: No hyperdense vessel or unexpected calcification. Skull: Normal. Negative for fracture or focal lesion. Sinuses/Orbits: No acute finding. Other: None. IMPRESSION: Normal exam. Electronically Signed   By: Norva Pavlov M.D.   On: 06/11/2018 16:00    Procedures Procedures (including  critical care time)  Medications Ordered in ED Medications  tetracaine (PONTOCAINE) 0.5 % ophthalmic solution 2 drop (2 drops Left Eye Given by Other 06/11/18 1355)  fluorescein ophthalmic strip 1 strip (1 strip Left Eye Given by Other 06/11/18 1355)     Initial Impression / Assessment and Plan / ED Course  I have reviewed the triage vital signs and the nursing notes.  Pertinent labs & imaging results that were available during my care of the patient were reviewed by me and considered in my medical decision making (see chart for details).     42 y.o. M with no significant past medical history who presents for evaluation of visual disturbance that occurred yesterday.  He reports for about 30 minutes, he is on [employer].  He states that it eventually resolved on  its own.  He does report a headache at that time.  Does report he has been having intermittent headaches for the last month.  Has not got it evaluated.  No numbness/weakness of his arms or legs, difficulty walking, fevers, weight loss.  Denies any symptoms today but felt like he needed to get checked out.  He does not have a PCP. Patient is afebrile, non-toxic appearing, sitting comfortably on examination table. Vital signs reviewed and stable.  EOMs intact without any difficulty.  No abnormalities seen on eye exam.  Neuro deficits noted on exam.  Sounds like he may have had a migraine with visual disturbance.  Given that he has new onset headaches and has never had evaluation before, will plan for CT for evaluation of any intracranial abnormality.  Visual acuity is documented below.  With lamp evaluation revealed no evidence of floor seen uptake or corneal abrasion.  Intraocular pressure as documented below:  Left IOP: 21, 20  Right IOP: 17, 21     Visual Acuity  Right Eye Distance: 20/25 Left Eye Distance: 20/20 Bilateral Distance: 20/15    CT head negative for any acute intracranial abnormality.  Discussed results with  patient.  He is not currently symptomatic.  We will plan for outpatient follow-up with neurology for possible migraine evaluation.  Additionally, it sounds like he may need IV exam as he states he has been straining to see over the last several months.  He is not been evaluated by an eye doctor in several years.  We will give him outpatient ophthalmology referral. Patient had ample opportunity for questions and discussion. All patient's questions were answered with full understanding. Strict return precautions discussed. Patient expresses understanding and agreement to plan.   Final Clinical Impressions(s) / ED Diagnoses   Final diagnoses:  Generalized headaches  Visual disturbance    ED Discharge Orders    None       Rosana Hoes 06/11/18 1717    Tilden Fossa, MD 06/12/18 1622

## 2018-06-11 NOTE — ED Triage Notes (Addendum)
Pt c/o seeing "diamonds" that were "rainbow colors" out of his L eye yesterday. Pt is not seeing spots today. Pt also reports HA x 2 weeks.

## 2018-08-31 ENCOUNTER — Other Ambulatory Visit: Payer: Self-pay

## 2018-08-31 ENCOUNTER — Ambulatory Visit (HOSPITAL_COMMUNITY)
Admission: EM | Admit: 2018-08-31 | Discharge: 2018-08-31 | Disposition: A | Discharge status: Home / Self Care | Payer: Self-pay | Attending: Family Medicine | Admitting: Family Medicine

## 2018-08-31 ENCOUNTER — Encounter (HOSPITAL_COMMUNITY): Payer: Self-pay | Admitting: Family Medicine

## 2018-08-31 DIAGNOSIS — S8001XA Contusion of right knee, initial encounter: Secondary | ICD-10-CM

## 2018-08-31 DIAGNOSIS — W010XXA Fall on same level from slipping, tripping and stumbling without subsequent striking against object, initial encounter: Secondary | ICD-10-CM

## 2018-08-31 MED ORDER — MELOXICAM 15 MG PO TABS: 15.0000 mg | ORAL_TABLET | Freq: Every day | ORAL | 1 refills | Status: DC

## 2018-08-31 NOTE — Discharge Instructions (Signed)
The tissue in front of the knee called a bursa and related to the tendon has been significantly bruised and that is why you have pain when squatting or kneeling.  We need to give this tissue a chance to heal over the next 10 days.  You need to follow-up with Worker's Comp. office over on [employer] in 10 days

## 2018-08-31 NOTE — ED Triage Notes (Signed)
Pt states he fell on his right knee. Pt also hurt his shoulder. Pt states he slipped on some glue. This happened yesterday at work.

## 2018-08-31 NOTE — ED Provider Notes (Signed)
[facility]    CSN: [id number] Arrival date & time: 08/31/18  1152     History   Chief Complaint Chief Complaint  Patient presents with  . Knee Pain  . Shoulder Pain    HPI Jerome Williams is a 42 y.o. male.   This is a 42 year old man comes in with a right knee injury.  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  He did not have any lasting pain until this morning when he went to squat down and had 2 minutes of intense pain over the kneecap.  He has had no swelling of the knee.  He has had no significant prior injury to that knee.  He has complete range of motion when sitting and is able to walk without pain.  The only time he feels pain is when he squats or tries to kneel down and put weight on the patella.     History reviewed. No pertinent past medical history.  There are no active problems to display for this patient.   Past Surgical History:  Procedure Laterality Date  . MOUTH SURGERY     x 2  . SKIN GRAFT     Injured right hand resulting in surgery and skin graft       Home Medications    Prior to Admission medications   Medication Sig Start Date End Date Taking? Authorizing Provider  famotidine (PEPCID) 20 MG tablet Take 1 tablet (20 mg total) by mouth 2 (two) times daily. 10/23/14   Vanetta Mulders, MD  meloxicam (MOBIC) 15 MG tablet Take 1 tablet (15 mg total) by mouth daily. 08/31/18   Elvina Sidle, MD    Family History History reviewed. No pertinent family history.  Social History Social History   Tobacco Use  . Smoking status: Current Every Day Smoker    Types: Cigars  . Smokeless tobacco: Never Used  Substance Use Topics  . Alcohol use: Yes    Comment: occasion  . Drug use: No     Allergies   Patient has no known allergies.   Review of Systems Review of Systems   Physical Exam Triage Vital Signs ED Triage Vitals    Enc Vitals Group     BP      Pulse      Resp      Temp      Temp src      SpO2      Weight      Height      Head Circumference      Peak Flow      Pain Score      Pain Loc      Pain Edu?      Excl. in GC?    No data found.  Updated Vital Signs BP (!) 151/102 (BP Location: Left Arm)   Pulse 88   Temp 98.5 F (36.9 C) (Oral)   Resp 16   Wt 93 kg   SpO2 100%   BMI 30.27 kg/m    Physical Exam   UC Treatments / Results  Labs (all labs ordered are listed, but only abnormal results are displayed) Labs Reviewed - No data to display  EKG None  Radiology No results found.  Procedures Procedures (including critical care time)  Medications Ordered in UC Medications - No data to display  Initial Impression / Assessment and  Plan / UC Course  I have reviewed the triage vital signs and the nursing notes.  Pertinent labs & imaging results that were available during my care of the patient were reviewed by me and considered in my medical decision making (see chart for details).    Final Clinical Impressions(s) / UC Diagnoses   Final diagnoses:  Contusion of right knee, initial encounter     Discharge Instructions     The tissue in front of the knee called a bursa and related to the tendon has been significantly bruised and that is why you have pain when squatting or kneeling.  We need to give this tissue a chance to heal over the next 10 days.  You need to follow-up with Worker's Comp. office over on [employer] in 10 days    ED Prescriptions    Medication Sig Dispense Auth. Provider   meloxicam (MOBIC) 15 MG tablet Take 1 tablet (15 mg total) by mouth daily. 10 tablet Elvina Sidle, MD     Controlled Substance Prescriptions  Controlled Substance Registry consulted? Not Applicable   Elvina Sidle, MD 08/31/18 1235

## 2018-11-12 ENCOUNTER — Encounter (HOSPITAL_BASED_OUTPATIENT_CLINIC_OR_DEPARTMENT_OTHER): Payer: Self-pay

## 2018-11-12 ENCOUNTER — Other Ambulatory Visit: Payer: Self-pay

## 2018-11-12 ENCOUNTER — Emergency Department (HOSPITAL_BASED_OUTPATIENT_CLINIC_OR_DEPARTMENT_OTHER)
Admission: EM | Admit: 2018-11-12 | Discharge: 2018-11-12 | Disposition: A | Discharge status: Home / Self Care | Payer: BLUE CROSS/BLUE SHIELD | Attending: Emergency Medicine | Admitting: Emergency Medicine

## 2018-11-12 DIAGNOSIS — F17200 Nicotine dependence, unspecified, uncomplicated: Secondary | ICD-10-CM | POA: Insufficient documentation

## 2018-11-12 DIAGNOSIS — M5431 Sciatica, right side: Secondary | ICD-10-CM

## 2018-11-12 DIAGNOSIS — M5441 Lumbago with sciatica, right side: Secondary | ICD-10-CM | POA: Diagnosis not present

## 2018-11-12 DIAGNOSIS — M545 Low back pain: Secondary | ICD-10-CM | POA: Diagnosis present

## 2018-11-12 MED ORDER — DIAZEPAM 5 MG PO TABS
5.0000 mg | ORAL_TABLET | Freq: Once | ORAL | Status: AC
  Administered 2018-11-12: 5 mg via ORAL
  Filled 2018-11-12: qty 1

## 2018-11-12 MED ORDER — KETOROLAC TROMETHAMINE 15 MG/ML IJ SOLN
15.0000 mg | Freq: Once | INTRAMUSCULAR | Status: AC
  Administered 2018-11-12: 15 mg via INTRAMUSCULAR
  Filled 2018-11-12: qty 1

## 2018-11-12 MED ORDER — OXYCODONE HCL 5 MG PO TABS
5.0000 mg | ORAL_TABLET | Freq: Once | ORAL | Status: AC
  Administered 2018-11-12: 5 mg via ORAL
  Filled 2018-11-12: qty 1

## 2018-11-12 MED ORDER — ACETAMINOPHEN 500 MG PO TABS
1000.0000 mg | ORAL_TABLET | Freq: Once | ORAL | Status: AC
  Administered 2018-11-12: 1000 mg via ORAL
  Filled 2018-11-12: qty 2

## 2018-11-12 NOTE — ED Notes (Signed)
ED Provider at bedside. 

## 2018-11-12 NOTE — Discharge Instructions (Signed)
Take 4 over the counter ibuprofen tablets 3 times a day or 2 over-the-counter naproxen tablets twice a day for pain. Also take tylenol 1000mg (2 extra strength) four times a day.    Follow-up with your family doctor or back doctor for this.  Return as discussed for one-sided weakness numbness or trouble pooping or peeing or if you get a fever.

## 2018-11-12 NOTE — ED Provider Notes (Addendum)
[facility] EMERGENCY DEPARTMENT Provider Note   CSN: [id number] Arrival date & time: 11/12/18  9758     History   Chief Complaint Chief Complaint  Patient presents with  . Back Pain    HPI Jerome Williams is a 43 y.o. male.  43 yo M with a chief complaint of right-sided sciatica.  This chronic problem for him he has had this multiple times previously.  Going on for the past month.  He is unsure of exact injury.  Denies trauma denies loss of bowel or bladder denies loss of peritoneal sensation denies weakness or numbness to the leg.  Worse with standing and sitting.  Denies fevers or chills.  Denies recent spinal injection.  The history is provided by the patient.  Back Pain   This is a new problem. The current episode started more than 1 week ago. The problem occurs constantly. The problem has been gradually worsening. The pain is associated with no known injury. The pain is present in the lumbar spine. The quality of the pain is described as stabbing and shooting. The pain radiates to the right foot. The pain is at a severity of 9/10. The pain is severe. The symptoms are aggravated by bending and twisting. Pertinent negatives include no chest pain, no fever, no numbness, no headaches, no abdominal pain, no bowel incontinence, no perianal numbness, no bladder incontinence, no tingling and no weakness. He has tried nothing for the symptoms. The treatment provided no relief.    History reviewed. No pertinent past medical history.  There are no active problems to display for this patient.   Past Surgical History:  Procedure Laterality Date  . MOUTH SURGERY     x 2  . SKIN GRAFT     Injured right hand resulting in surgery and skin graft        Home Medications    Prior to Admission medications   Medication Sig Start Date End Date Taking? Authorizing Provider  famotidine (PEPCID) 20 MG tablet Take 1 tablet (20 mg total) by mouth 2 (two) times daily. 10/23/14    Vanetta Mulders, MD  meloxicam (MOBIC) 15 MG tablet Take 1 tablet (15 mg total) by mouth daily. 08/31/18   Elvina Sidle, MD    Family History No family history on file.  Social History Social History   Tobacco Use  . Smoking status: Current Every Day Smoker    Types: Cigars  . Smokeless tobacco: Never Used  Substance Use Topics  . Alcohol use: Yes    Comment: occasion  . Drug use: No     Allergies   Patient has no known allergies.   Review of Systems Review of Systems  Constitutional: Negative for chills and fever.  HENT: Negative for congestion and facial swelling.   Eyes: Negative for discharge and visual disturbance.  Respiratory: Negative for shortness of breath.   Cardiovascular: Negative for chest pain and palpitations.  Gastrointestinal: Negative for abdominal pain, bowel incontinence, diarrhea and vomiting.  Genitourinary: Negative for bladder incontinence.  Musculoskeletal: Positive for back pain. Negative for arthralgias and myalgias.  Skin: Negative for color change and rash.  Neurological: Negative for tingling, tremors, syncope, weakness, numbness and headaches.  Psychiatric/Behavioral: Negative for confusion and dysphoric mood.     Physical Exam Updated Vital Signs BP 139/90 (BP Location: Left Arm)   Pulse 74   Temp 98 F (36.7 C) (Oral)   Resp 16   Ht 5\' 9"  (1.753 m)   Wt 95.3  kg   SpO2 97%   BMI 31.01 kg/m   Physical Exam Vitals signs and nursing note reviewed.  Constitutional:      Appearance: He is well-developed.  HENT:     Head: Normocephalic and atraumatic.  Eyes:     Pupils: Pupils are equal, round, and reactive to light.  Neck:     Musculoskeletal: Normal range of motion and neck supple.     Vascular: No JVD.  Cardiovascular:     Rate and Rhythm: Normal rate and regular rhythm.     Heart sounds: No murmur. No friction rub. No gallop.   Pulmonary:     Effort: No respiratory distress.     Breath sounds: No wheezing.    Abdominal:     General: There is no distension.     Tenderness: There is no guarding or rebound.  Musculoskeletal: Normal range of motion.        General: Tenderness present.     Comments: Mild tenderness about the right SI joint.  Pulse motor and sensation is intact distally.  Negative straight leg raise test bilaterally.  Skin:    Coloration: Skin is not pale.     Findings: No rash.  Neurological:     Mental Status: He is alert and oriented to person, place, and time.     Deep Tendon Reflexes: Babinski sign absent on the right side.     Reflex Scores:      Patellar reflexes are 2+ on the right side.      Achilles reflexes are 2+ on the right side.    Comments: No clonus  Psychiatric:        Behavior: Behavior normal.      ED Treatments / Results  Labs (all labs ordered are listed, but only abnormal results are displayed) Labs Reviewed - No data to display  EKG None  Radiology No results found.  Procedures Procedures (including critical care time)  Medications Ordered in ED Medications  acetaminophen (TYLENOL) tablet 1,000 mg (has no administration in time range)  ketorolac (TORADOL) 15 MG/ML injection 15 mg (has no administration in time range)  oxyCODONE (Oxy IR/ROXICODONE) immediate release tablet 5 mg (has no administration in time range)  diazepam (VALIUM) tablet 5 mg (has no administration in time range)     Initial Impression / Assessment and Plan / ED Course  I have reviewed the triage vital signs and the nursing notes.  Pertinent labs & imaging results that were available during my care of the patient were reviewed by me and considered in my medical decision making (see chart for details).     43 yo M with a chief complaint of right-sided low back pain.  Patient has a history of this and feels the same.  No red flags.  We will treat his pain here have him follow-up with his PCP.  10:08 AM:  I have discussed the diagnosis/risks/treatment options with  the patient and believe the pt to be eligible for discharge home to follow-up with PCP. We also discussed returning to the ED immediately if new or worsening sx occur. We discussed the sx which are most concerning (e.g., sudden worsening pain, fever, inability to tolerate by mouth) that necessitate immediate return. Medications administered to the patient during their visit and any new prescriptions provided to the patient are listed below.  Medications given during this visit Medications  acetaminophen (TYLENOL) tablet 1,000 mg (has no administration in time range)  ketorolac (TORADOL) 15 MG/ML injection 15  mg (has no administration in time range)  oxyCODONE (Oxy IR/ROXICODONE) immediate release tablet 5 mg (has no administration in time range)  diazepam (VALIUM) tablet 5 mg (has no administration in time range)     The patient appears reasonably screen and/or stabilized for discharge and I doubt any other medical condition or other [facility] requiring further screening, evaluation, or treatment in the ED at this time prior to discharge.    Final Clinical Impressions(s) / ED Diagnoses   Final diagnoses:  Sciatica of right side    ED Discharge Orders    None       Melene PlanFloyd, Tomaz Janis, DO 11/12/18 1008    Melene PlanFloyd, Adisson Deak, DO 11/12/18 1008

## 2018-11-12 NOTE — ED Triage Notes (Signed)
Pt reports right lower back pain . Sts sciata flare up. Reports ibuprofen and cream normally work, but it's not now.

## 2019-01-02 ENCOUNTER — Ambulatory Visit (INDEPENDENT_AMBULATORY_CARE_PROVIDER_SITE_OTHER): Payer: Self-pay

## 2019-01-02 ENCOUNTER — Other Ambulatory Visit: Payer: Self-pay | Admitting: Gerontology

## 2019-01-02 DIAGNOSIS — Z021 Encounter for pre-employment examination: Secondary | ICD-10-CM

## 2020-02-05 ENCOUNTER — Other Ambulatory Visit: Payer: Self-pay | Admitting: Physician Assistant

## 2020-02-05 ENCOUNTER — Other Ambulatory Visit: Payer: Self-pay

## 2020-02-05 ENCOUNTER — Ambulatory Visit (INDEPENDENT_AMBULATORY_CARE_PROVIDER_SITE_OTHER): Payer: Self-pay

## 2020-02-05 DIAGNOSIS — Z0000 Encounter for general adult medical examination without abnormal findings: Secondary | ICD-10-CM

## 2020-02-13 IMAGING — CT CT HEAD W/O CM
3 series · 15 of 47 positions shown, 18 images · non-contrast
Comparison: CT of the head on 10/23/2014

CLINICAL DATA: Pt c/o seeing "diamonds" that were "rainbow colors"
out of his L eye yesterday. Pt is not seeing spots today. Pt also
reports HA x 2 weeks Denies n/v, denies numbness or tingling in arms
or legs, no dizziness, states he could not walk during visual
disturbances.

EXAM:
CT HEAD WITHOUT CONTRAST
TECHNIQUE: Contiguous axial images were obtained from the base of the skull
through the vertex without intravenous contrast.

[Series 2: head wo · axial · 0.50mm/px · z∈[-170,-30]mm · 9 of 34 slices shown, 12 images]
[im 3/34  brain]
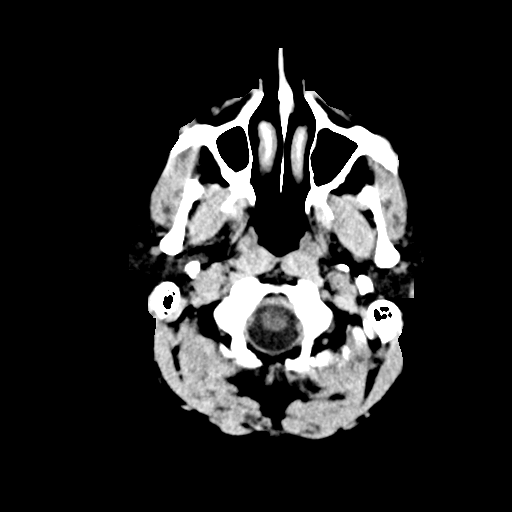
[im 3/34  bone]
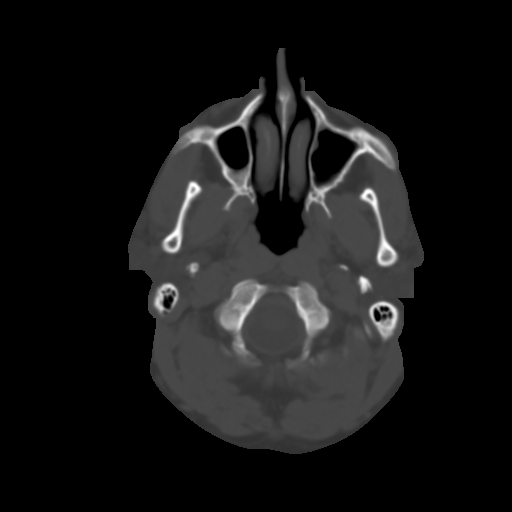
[im 6/34  brain]
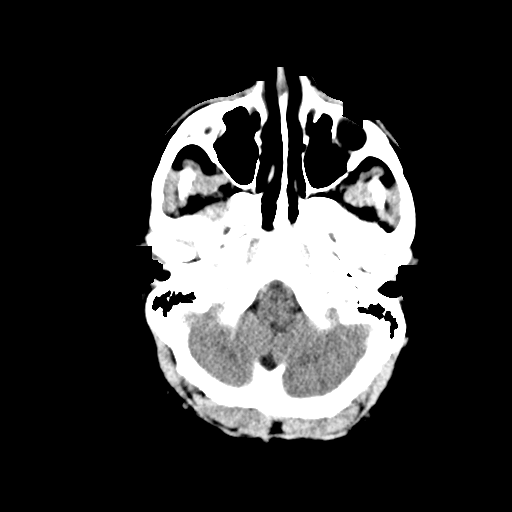
[im 10/34  brain]
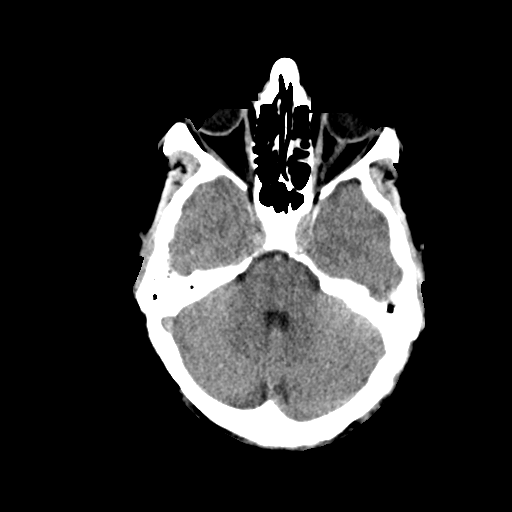
[im 13/34  brain]
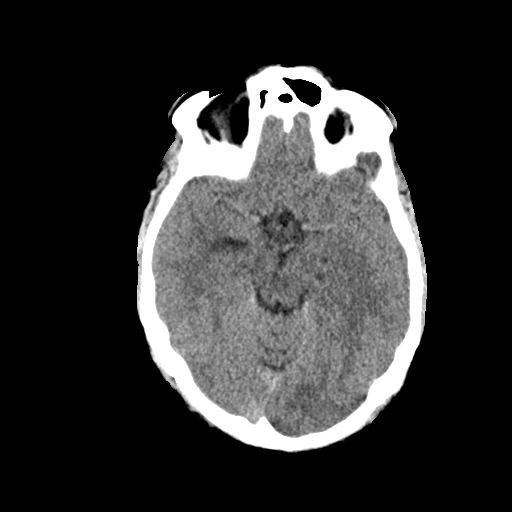
[im 18/34  brain]
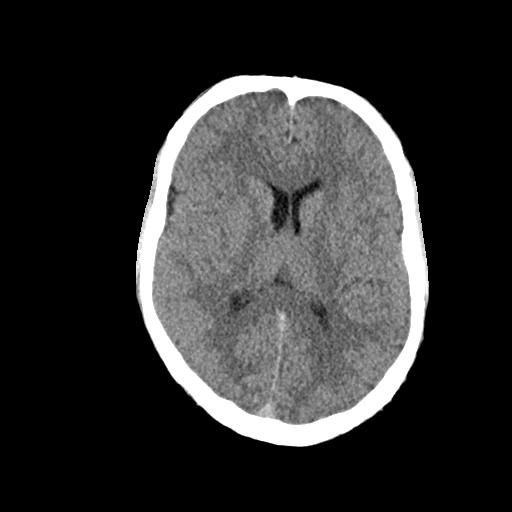
[im 18/34  bone]
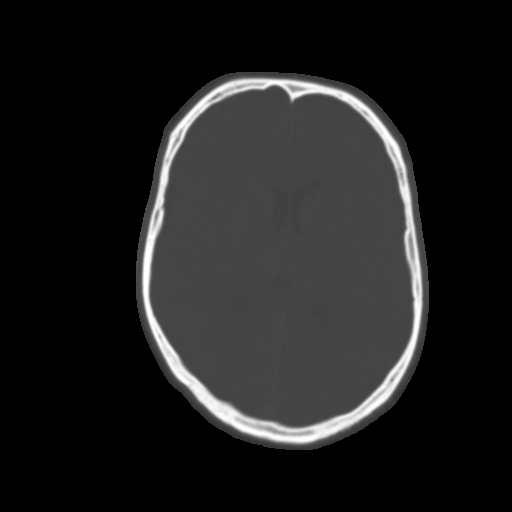
[im 21/34  brain]
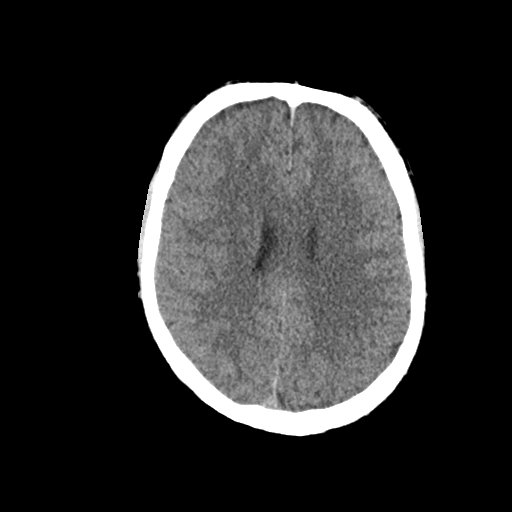
[im 24/34  brain]
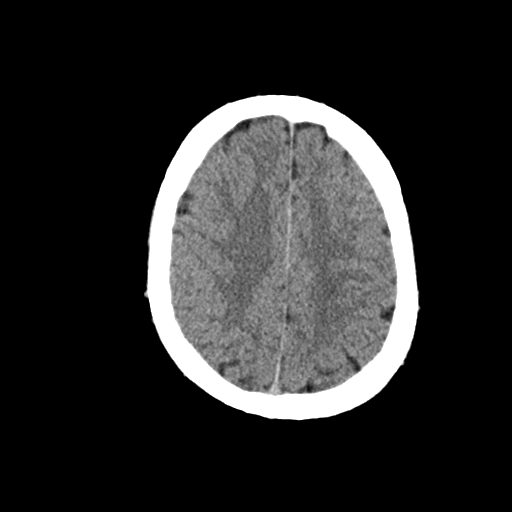
[im 28/34  brain]
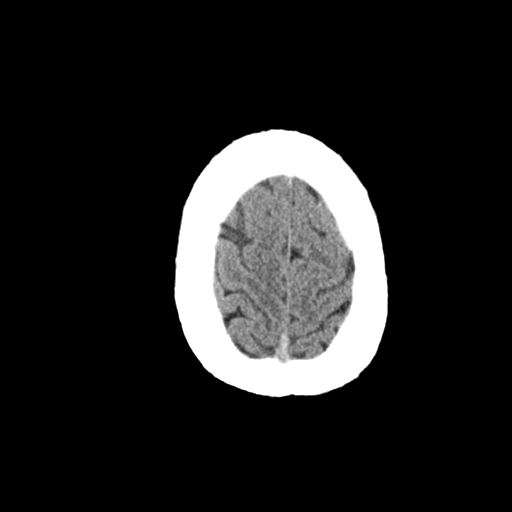
[im 31/34  brain]
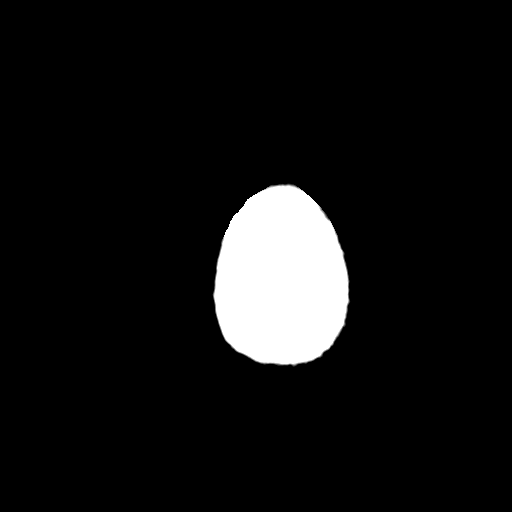
[im 31/34  bone]
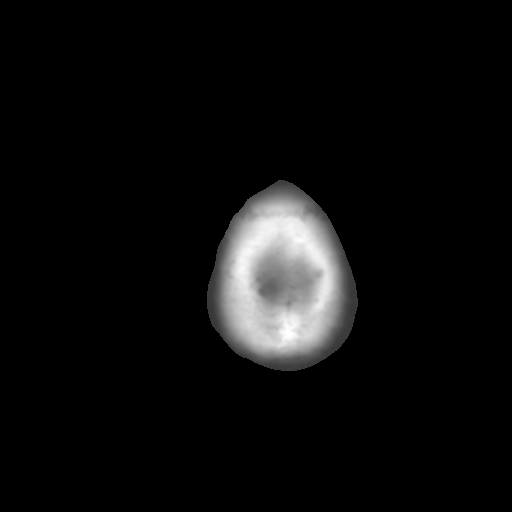

[Series 4: coronal soft · coronal · 0.33mm/px · 3 of 69 slices shown]
[im 23/69  brain]
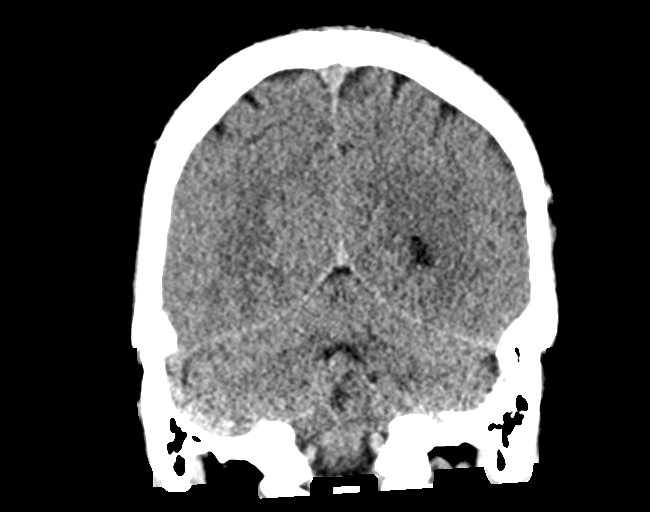
[im 31/69  brain]
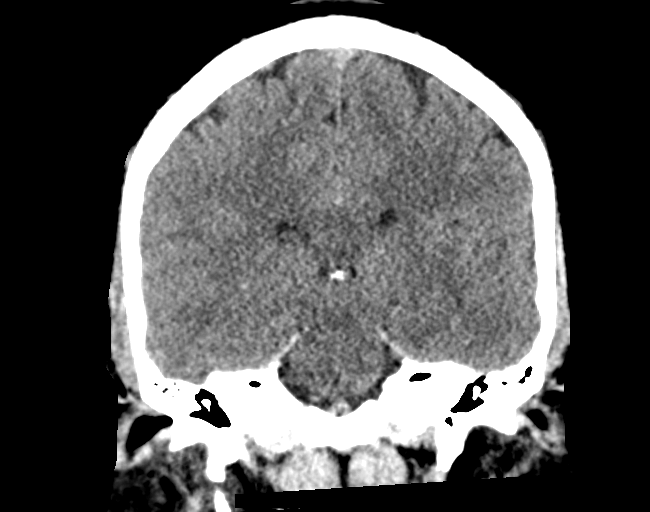
[im 38/69  brain]
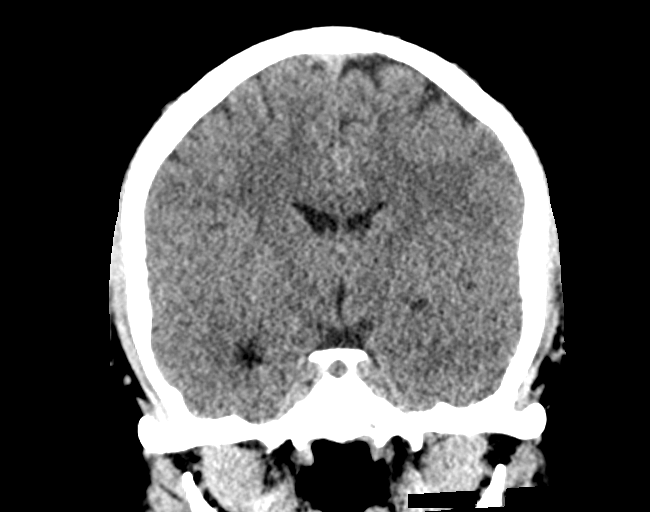

[Series 5: sag soft · sagittal · 0.33mm/px · 3 of 57 slices shown]
[im 19/57  brain]
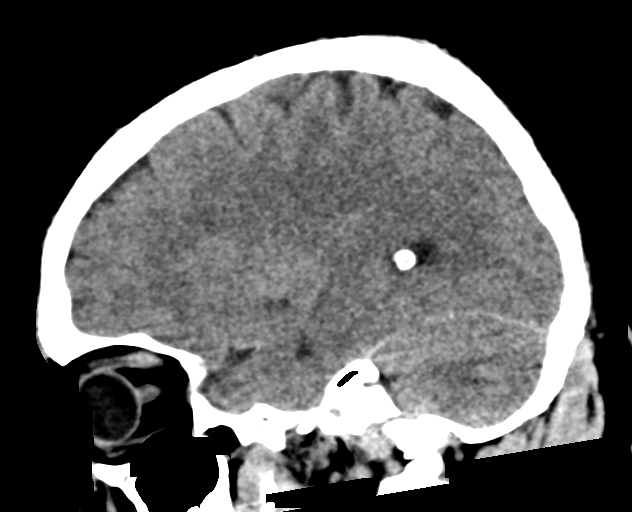
[im 29/57  brain]
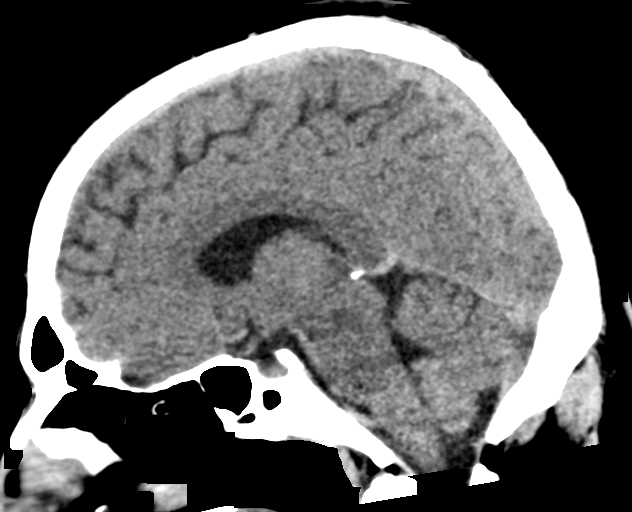
[im 38/57  brain]
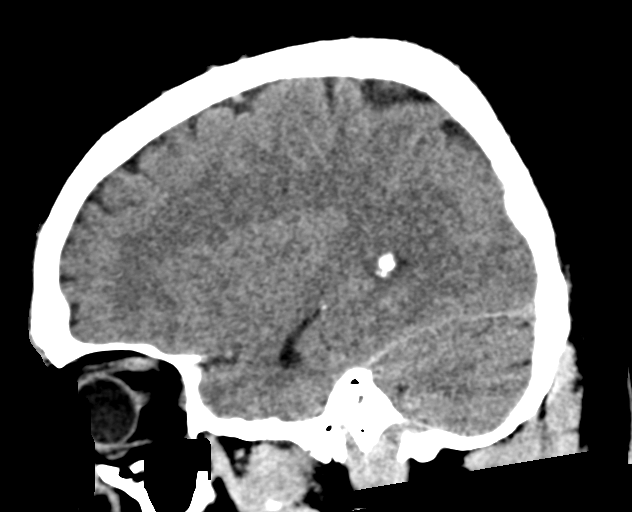

[15 of 47 positions shown; findings below may reference images not displayed]

FINDINGS: Brain: No evidence of acute infarction, hemorrhage, hydrocephalus,
extra-axial collection or mass lesion/mass effect.

Vascular: No hyperdense vessel or unexpected calcification.

Skull: Normal. Negative for fracture or focal lesion.

Sinuses/Orbits: No acute finding.

Other: None.
IMPRESSION: Normal exam.

## 2020-09-05 IMAGING — DX DG CHEST 1V
1 series · 1 of 1 positions shown · non-contrast
Comparison: None.

CLINICAL DATA: Pre-employment physical

EXAM:
CHEST  1 VIEW

[chest pa]
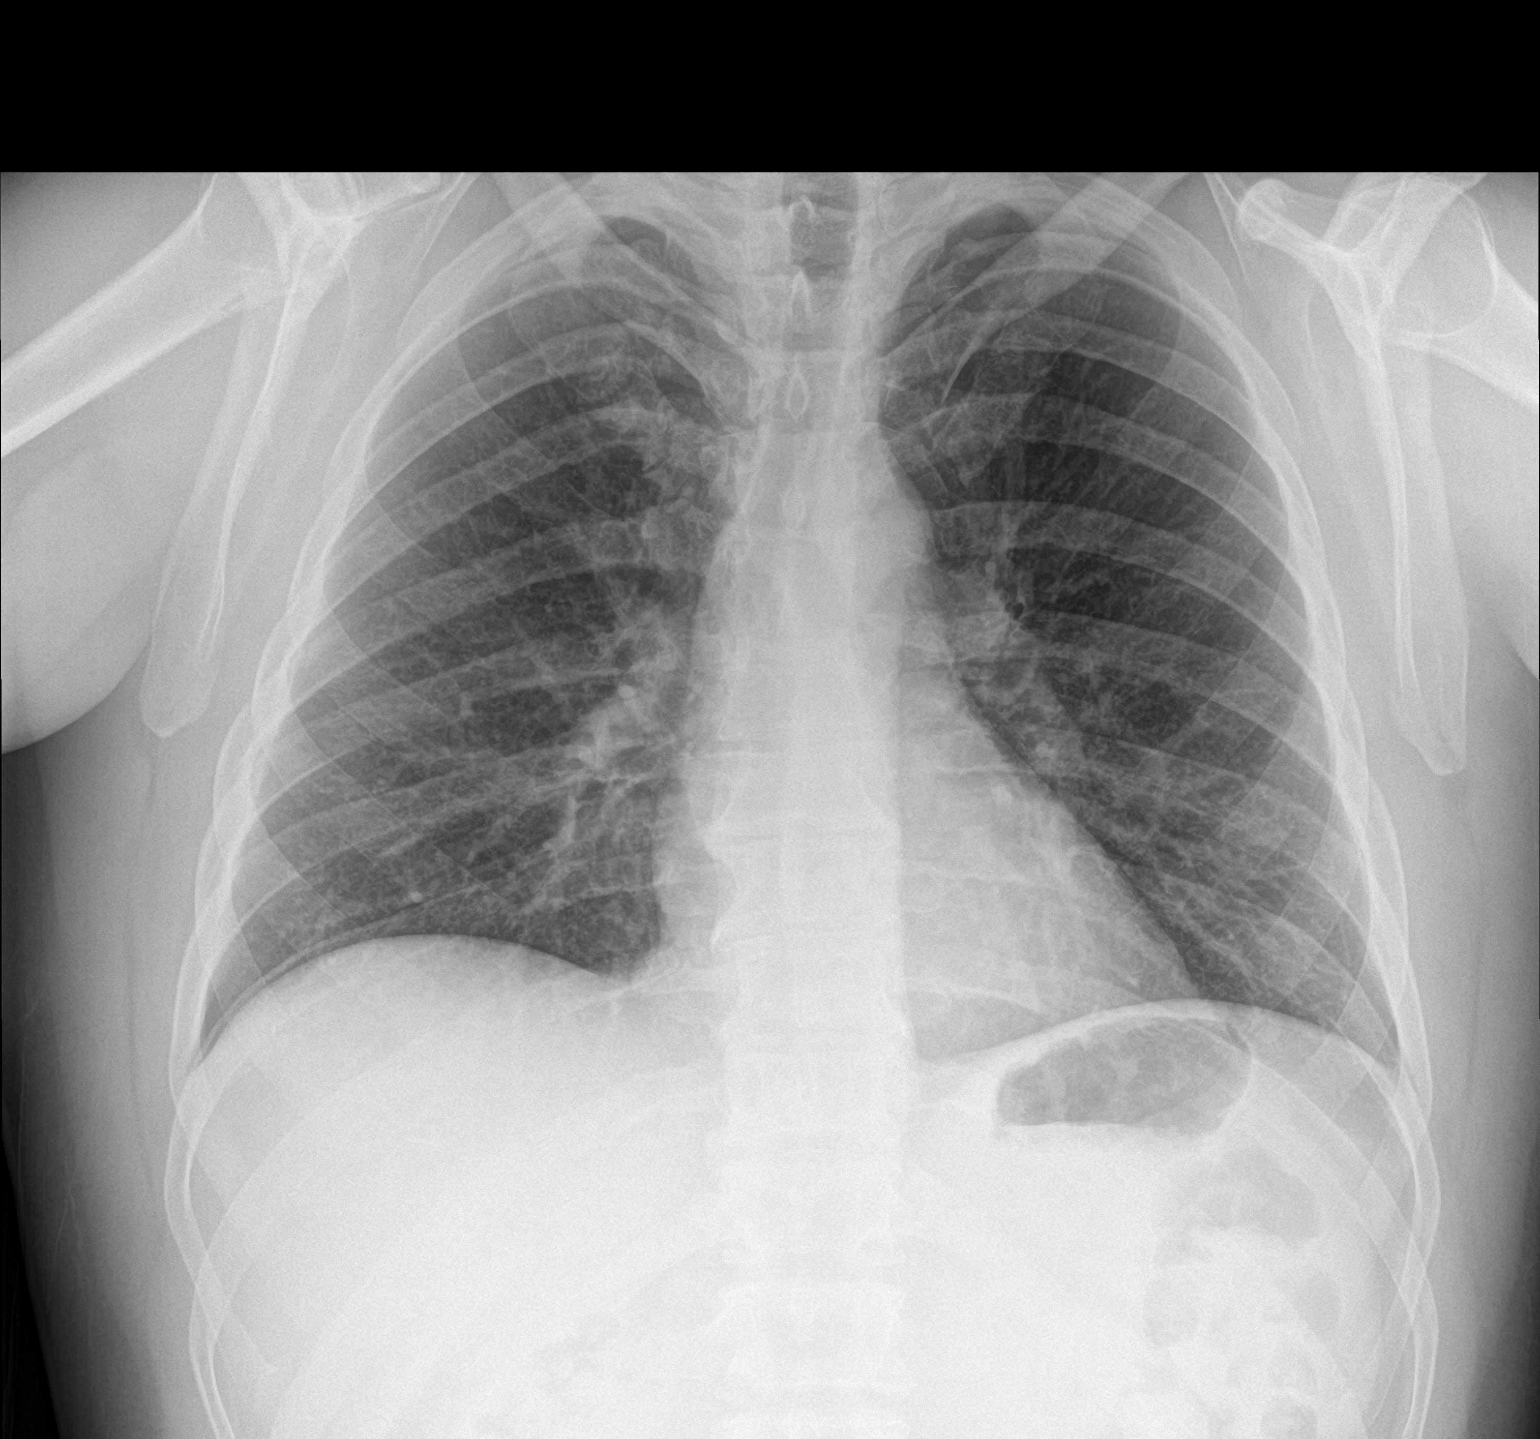

[1 of 1 positions shown; findings below may reference images not displayed]

FINDINGS: The heart size and mediastinal contours are within normal limits.
Both lungs are clear. The visualized skeletal structures are
unremarkable.
IMPRESSION: No active disease.

## 2021-12-31 ENCOUNTER — Other Ambulatory Visit: Payer: Self-pay

## 2021-12-31 ENCOUNTER — Other Ambulatory Visit: Payer: Self-pay | Admitting: Physician Assistant

## 2021-12-31 ENCOUNTER — Ambulatory Visit (INDEPENDENT_AMBULATORY_CARE_PROVIDER_SITE_OTHER): Payer: Self-pay

## 2021-12-31 DIAGNOSIS — Z0000 Encounter for general adult medical examination without abnormal findings: Secondary | ICD-10-CM

## 2022-05-26 NOTE — Progress Notes (Signed)
 Employment screening

## 2023-01-31 ENCOUNTER — Emergency Department (HOSPITAL_BASED_OUTPATIENT_CLINIC_OR_DEPARTMENT_OTHER): Payer: Self-pay

## 2023-01-31 ENCOUNTER — Encounter (HOSPITAL_BASED_OUTPATIENT_CLINIC_OR_DEPARTMENT_OTHER): Payer: Self-pay | Admitting: Urology

## 2023-01-31 ENCOUNTER — Other Ambulatory Visit: Payer: Self-pay

## 2023-01-31 ENCOUNTER — Emergency Department (HOSPITAL_BASED_OUTPATIENT_CLINIC_OR_DEPARTMENT_OTHER)
Admission: EM | Admit: 2023-01-31 | Discharge: 2023-01-31 | Disposition: A | Discharge status: Home / Self Care | Payer: Self-pay | Attending: Emergency Medicine | Admitting: Emergency Medicine

## 2023-01-31 DIAGNOSIS — R1084 Generalized abdominal pain: Secondary | ICD-10-CM | POA: Insufficient documentation

## 2023-01-31 DIAGNOSIS — R160 Hepatomegaly, not elsewhere classified: Secondary | ICD-10-CM | POA: Insufficient documentation

## 2023-01-31 DIAGNOSIS — J0110 Acute frontal sinusitis, unspecified: Secondary | ICD-10-CM | POA: Insufficient documentation

## 2023-01-31 DIAGNOSIS — F1210 Cannabis abuse, uncomplicated: Secondary | ICD-10-CM | POA: Insufficient documentation

## 2023-01-31 DIAGNOSIS — Z20822 Contact with and (suspected) exposure to covid-19: Secondary | ICD-10-CM | POA: Insufficient documentation

## 2023-01-31 DIAGNOSIS — R634 Abnormal weight loss: Secondary | ICD-10-CM | POA: Insufficient documentation

## 2023-01-31 DIAGNOSIS — F17200 Nicotine dependence, unspecified, uncomplicated: Secondary | ICD-10-CM | POA: Insufficient documentation

## 2023-01-31 LAB — CBC WITH DIFFERENTIAL/PLATELET
Abs Immature Granulocytes: 0.02 10*3/uL (ref 0.00–0.07)
Basophils Absolute: 0.1 10*3/uL (ref 0.0–0.1)
Basophils Relative: 1 %
Eosinophils Absolute: 0.3 10*3/uL (ref 0.0–0.5)
Eosinophils Relative: 4 %
HCT: 46.6 % (ref 39.0–52.0)
Hemoglobin: 16.9 g/dL (ref 13.0–17.0)
Immature Granulocytes: 0 %
Lymphocytes Relative: 31 %
Lymphs Abs: 2.4 10*3/uL (ref 0.7–4.0)
MCH: 33.5 pg (ref 26.0–34.0)
MCHC: 36.3 g/dL — ABNORMAL HIGH (ref 30.0–36.0)
MCV: 92.3 fL (ref 80.0–100.0)
Monocytes Absolute: 0.8 10*3/uL (ref 0.1–1.0)
Monocytes Relative: 11 %
Neutro Abs: 4.1 10*3/uL (ref 1.7–7.7)
Neutrophils Relative %: 53 %
Platelets: 319 10*3/uL (ref 150–400)
RBC: 5.05 MIL/uL (ref 4.22–5.81)
RDW: 12.2 % (ref 11.5–15.5)
Smear Review: NORMAL
WBC Morphology: ABNORMAL
WBC: 7.7 10*3/uL (ref 4.0–10.5)
nRBC: 0 % (ref 0.0–0.2)

## 2023-01-31 LAB — RAPID URINE DRUG SCREEN, HOSP PERFORMED
Amphetamines: NOT DETECTED
Barbiturates: NOT DETECTED
Benzodiazepines: NOT DETECTED
Cocaine: NOT DETECTED
Opiates: NOT DETECTED
Tetrahydrocannabinol: POSITIVE — AB

## 2023-01-31 LAB — COMPREHENSIVE METABOLIC PANEL
ALT: 49 U/L — ABNORMAL HIGH (ref 0–44)
AST: 43 U/L — ABNORMAL HIGH (ref 15–41)
Albumin: 3.6 g/dL (ref 3.5–5.0)
Alkaline Phosphatase: 73 U/L (ref 38–126)
Anion gap: 7 (ref 5–15)
BUN: 11 mg/dL (ref 6–20)
CO2: 27 mmol/L (ref 22–32)
Calcium: 8.4 mg/dL — ABNORMAL LOW (ref 8.9–10.3)
Chloride: 103 mmol/L (ref 98–111)
Creatinine, Ser: 0.94 mg/dL (ref 0.61–1.24)
GFR, Estimated: >60 mL/min (ref >60)
Glucose, Bld: 106 mg/dL — ABNORMAL HIGH (ref 70–99)
Potassium: 3.1 mmol/L — ABNORMAL LOW (ref 3.5–5.1)
Sodium: 137 mmol/L (ref 135–145)
Total Bilirubin: 0.8 mg/dL (ref 0.3–1.2)
Total Protein: 7.6 g/dL (ref 6.5–8.1)

## 2023-01-31 LAB — LIPASE, BLOOD: Lipase: 40 U/L (ref 11–51)

## 2023-01-31 LAB — RESP PANEL BY RT-PCR (RSV, FLU A&B, COVID)  RVPGX2
Influenza A by PCR: NEGATIVE
Influenza B by PCR: NEGATIVE
Resp Syncytial Virus by PCR: NEGATIVE
SARS Coronavirus 2 by RT PCR: NEGATIVE

## 2023-01-31 LAB — TROPONIN I (HIGH SENSITIVITY): Troponin I (High Sensitivity): 3 ng/L (ref <18)

## 2023-01-31 LAB — HIV ANTIBODY (ROUTINE TESTING W REFLEX): HIV Screen 4th Generation wRfx: NONREACTIVE

## 2023-01-31 MED ORDER — DICYCLOMINE HCL 20 MG PO TABS: 20.0000 mg | ORAL_TABLET | Freq: Three times a day (TID) | ORAL | 0 refills | Status: DC | PRN

## 2023-01-31 MED ORDER — FLUTICASONE PROPIONATE 50 MCG/ACT NA SUSP: 2.0000 | Freq: Every day | NASAL | 0 refills | Status: AC

## 2023-01-31 MED ORDER — ONDANSETRON 4 MG PO TBDP: 4.0000 mg | ORAL_TABLET | Freq: Three times a day (TID) | ORAL | 0 refills | Status: DC | PRN

## 2023-01-31 MED ORDER — PANTOPRAZOLE SODIUM 40 MG PO TBEC: 40.0000 mg | DELAYED_RELEASE_TABLET | Freq: Every day | ORAL | 2 refills | Status: AC

## 2023-01-31 MED ORDER — AZITHROMYCIN 250 MG PO TABS: 250.0000 mg | ORAL_TABLET | Freq: Every day | ORAL | 0 refills | Status: DC

## 2023-01-31 MED ORDER — SODIUM CHLORIDE 0.9 % IV BOLUS
500.0000 mL | Freq: Once | INTRAVENOUS | Status: AC
  Administered 2023-01-31: 500 mL via INTRAVENOUS

## 2023-01-31 MED ORDER — IOHEXOL 300 MG/ML  SOLN
100.0000 mL | Freq: Once | INTRAMUSCULAR | Status: AC | PRN
  Administered 2023-01-31: 100 mL via INTRAVENOUS

## 2023-01-31 NOTE — ED Notes (Signed)
Pt transported to imaging.

## 2023-01-31 NOTE — ED Triage Notes (Signed)
Pt states has sinus infection was taking antibiotic and didn't finish full course of antibiotics and it started back again  Per family pt has been feeling weak with loss of appetite for past 3 months, states has lost over 30 lbs over past 3 months

## 2023-01-31 NOTE — ED Provider Notes (Signed)
Emergency Department Provider Note   I have reviewed the triage vital signs and the nursing notes.   HISTORY  Chief Complaint Weight Loss and Sinusitis    HPI Jerome Williams is a 46 y.o. male with past history reviewed below presents emergency department with sinus pain.  He was prescribed a dose of antibiotics but stopped it early after some resolution in symptoms several weeks ago.  His congestion and face pain have returned.  He is seeking relief from this but his daughter, who accompanies him also notes progressively worsening generalized weakness and unintentional weight loss.  He tells me he is lost around 30 pounds over the last 3 months.  Denies chest pain or shortness of breath.  No cough.  He does have occasional stomach discomfort with nausea.  He states often he does not feel like eating because of nausea and abdominal discomfort.  No blood in his bowel movements.  No fevers.  No tick bites.  History reviewed. No pertinent past medical history.  Review of Systems  Constitutional: No fever/chills Cardiovascular: Denies chest pain. Respiratory: Denies shortness of breath. Gastrointestinal: Positive abdominal pain. Positive nausea, no vomiting.  No diarrhea.  No constipation. Genitourinary: Negative for dysuria. Musculoskeletal: Negative for back pain. Skin: Negative for rash. Neurological: Negative for headaches, focal weakness or numbness.  ____________________________________________   PHYSICAL EXAM:  VITAL SIGNS: ED Triage Vitals  Enc Vitals Group     BP 01/31/23 1225 125/78     Pulse Rate 01/31/23 1225 64     Resp 01/31/23 1225 18     Temp 01/31/23 1225 97.8 F (36.6 C)     Temp Source 01/31/23 1225 Oral     SpO2 01/31/23 1225 97 %     Weight 01/31/23 1224 210 lb 1.6 oz (95.3 kg)     Height 01/31/23 1224 5\' 9"  (1.753 m)   Constitutional: Alert and oriented. Well appearing and in no acute distress. Eyes: Conjunctivae are normal.  Head:  Atraumatic. Nose: No congestion/rhinnorhea.  Tenderness over the frontal sinuses. Mouth/Throat: Mucous membranes are moist.  Oropharynx non-erythematous. Neck: No stridor.  Cardiovascular: Normal rate, regular rhythm. Good peripheral circulation. Grossly normal heart sounds.   Respiratory: Normal respiratory effort.  No retractions. Lungs CTAB. Gastrointestinal: Soft and nontender. No distention.  Musculoskeletal: No lower extremity tenderness nor edema. No gross deformities of extremities. Neurologic:  Normal speech and language. No gross focal neurologic deficits are appreciated.  Skin:  Skin is warm, dry and intact. No rash noted.  ____________________________________________   LABS (all labs ordered are listed, but only abnormal results are displayed)  Labs Reviewed  COMPREHENSIVE METABOLIC PANEL - Abnormal; Notable for the following components:      Result Value   Potassium 3.1 (*)    Glucose, Bld 106 (*)    Calcium 8.4 (*)    AST 43 (*)    ALT 49 (*)    All other components within normal limits  CBC WITH DIFFERENTIAL/PLATELET - Abnormal; Notable for the following components:   MCHC 36.3 (*)    All other components within normal limits  RAPID URINE DRUG SCREEN, HOSP PERFORMED - Abnormal; Notable for the following components:   Tetrahydrocannabinol POSITIVE (*)    All other components within normal limits  RESP PANEL BY RT-PCR (RSV, FLU A&B, COVID)  RVPGX2  LIPASE, BLOOD  HIV ANTIBODY (ROUTINE TESTING W REFLEX)  TROPONIN I (HIGH SENSITIVITY)   ____________________________________________  EKG   EKG Interpretation  Date/Time:  Sunday January 31 2023 13:05:22 EDT Ventricular Rate:  55 PR Interval:  135 QRS Duration: 85 QT Interval:  419 QTC Calculation: 401 R Axis:   86 Text Interpretation: Sinus rhythm Confirmed by Nanda Quinton [phone]) on 01/31/2023 1:07:45 PM        ____________________________________________  RADIOLOGY  CT ABDOMEN PELVIS W  CONTRAST  Result Date: 01/31/2023 CLINICAL DATA:  47 year old with abdominal pain, acute, nonlocalized. Weakness and loss of appetite. Recent weight loss. EXAM: CT ABDOMEN AND PELVIS WITH CONTRAST TECHNIQUE: Multidetector CT imaging of the abdomen and pelvis was performed using the standard protocol following bolus administration of intravenous contrast. RADIATION DOSE REDUCTION: This exam was performed according to the departmental dose-optimization program which includes automated exposure control, adjustment of the mA and/or kV according to patient size and/or use of iterative reconstruction technique. CONTRAST:  162mL OMNIPAQUE IOHEXOL 300 MG/ML  SOLN COMPARISON:  10/23/2014 FINDINGS: Lower chest: Small dependent densities at the lung bases. No pleural effusions. Hepatobiliary: Again noted are scattered hypodensities in the liver and many have enlarged or become more conspicuous since 2015. 1.2 cm hypodensity in the right hepatic lobe on CT image 23/2 likely represents a cyst and previously measured 0.9 cm. Poorly defined hypodensity along the anterior liver on image 18/2 measures up to 2.0 cm and more conspicuous than the previous examination. There is a subtle lesion along the medial right hepatic lobe on image 20/2 which was more hyperdense or enhancing on the previous examination and this could represent a cavernous hemangioma. Some hypodensities are appear to be new. Many of these hypodensities are indeterminate and too small to definitively characterize. No significant biliary dilatation. The gallbladder is normal. Portal venous system is patent. Pancreas: Unremarkable. No pancreatic ductal dilatation or surrounding inflammatory changes. Spleen: Normal in size without focal abnormality. Adrenals/Urinary Tract: Normal appearance of the adrenal glands. Normal appearance of both kidneys without hydronephrosis. No suspicious renal lesion. Urinary bladder is decompressed. Stomach/Bowel: Stomach is within  normal limits. Appendix appears normal. No evidence of bowel wall thickening, distention, or inflammatory changes. Vascular/Lymphatic: No significant vascular findings are present. No enlarged abdominal or pelvic lymph nodes. Reproductive: Prostate contains calcifications. Other: Negative for free fluid. Negative for free air. 1.2 cm low-density structure just below the left posterior abdomen skin surface on image 17/2. This could represent a small sebaceous cyst. Musculoskeletal: Grade 1 retrolisthesis at L5-S1. No acute bone abnormality. IMPRESSION: 1. No acute abnormality in the abdomen or pelvis. 2. Multiple indeterminate hypodensities scattered throughout the liver. Many of these hypodensities were present on the exam from 2015 but some of them are larger or more conspicuous than they were in 2015. Suspect these represent benign etiologies such as hepatic cysts and/or cavernous hemangiomas. If there is clinical concern for potential malignancy, consider further characterization with an abdominal MRI, with and without contrast 3. Possible small sebaceous cyst along the left posterior abdomen. Electronically Signed   By: Markus Daft M.D.   On: 01/31/2023 14:27   DG Chest 2 View  Result Date: 01/31/2023 CLINICAL DATA:  SOB EXAM: CHEST - 2 VIEW COMPARISON:  12/31/2021 FINDINGS: Cardiac silhouette is unremarkable. No pneumothorax or pleural effusion. The lungs are clear. The visualized skeletal structures are unremarkable. IMPRESSION: No acute cardiopulmonary process. Electronically Signed   By: Sammie Bench M.D.   On: 01/31/2023 13:10    ____________________________________________   PROCEDURES  Procedure(s) performed:   Procedures  None  ____________________________________________   INITIAL IMPRESSION / ASSESSMENT AND PLAN / ED COURSE  Pertinent labs & imaging  results that were available during my care of the patient were reviewed by me and considered in my medical decision making (see  chart for details).   This patient is Presenting for Evaluation of abdominal pain, which does require a range of treatment options, and is a complaint that involves a high risk of morbidity and mortality.  The Differential Diagnoses includes but is not exclusive to acute cholecystitis, intrathoracic causes for epigastric abdominal pain, gastritis, duodenitis, pancreatitis, small bowel or large bowel obstruction, abdominal aortic aneurysm, hernia, gastritis, etc.   Critical Interventions-    Medications  sodium chloride 0.9 % bolus 500 mL (0 mLs Intravenous Stopped 01/31/23 1414)  iohexol (OMNIPAQUE) 300 MG/ML solution 100 mL (100 mLs Intravenous Contrast Given 01/31/23 1348)    Reassessment after intervention: Symptoms improved.    I did obtain Additional Historical Information from daughter at bedside.    Clinical Laboratory Tests Ordered, included CBC without leukocytosis or severe anemia.  Respiratory panel negative.  No acute kidney injury.  Lipase and troponin normal.  HIV nonreactive.  Radiologic Tests Ordered, included CT abdomen/pelvis and CXR. I independently interpreted the images and agree with radiology interpretation.   Cardiac Monitor Tracing which shows NSR.    Social Determinants of Health Risk patient is a smoker.   Medical Decision Making: Summary:  Patient presents emergency department for evaluation of sinusitis primarily but in further discussion has had significant unintentional weight loss, nausea, abdominal discomfort.  No focal tenderness on exam.  Lab work overall is reassuring.  CT imaging of the abdomen and pelvis show multiple liver lesions seen previously but more conspicuous.  Likely benign but given his unintentional weight loss systems we discussed this in encouraged outpatient abdominal MRI for further evaluation.  Plan for symptom management at home including Protonix, Bentyl, Zofran.  Will start patient on azithromycin and Flonase for his sinusitis.   Gave contact information for primary care physician.  Considered admission but symptoms are overall reassuring and workup reassuring.  No clear indication for admission.  Patient's presentation is most consistent with acute presentation with potential threat to life or bodily function.   Disposition: discharge  ____________________________________________  FINAL CLINICAL IMPRESSION(S) / ED DIAGNOSES  Final diagnoses:  Acute non-recurrent frontal sinusitis  Generalized abdominal pain  Unintentional weight loss  Liver masses     NEW OUTPATIENT MEDICATIONS STARTED DURING THIS VISIT:  Discharge Medication List as of 01/31/2023  3:17 PM     START taking these medications   Details  azithromycin (ZITHROMAX) 250 MG tablet Take 1 tablet (250 mg total) by mouth daily. Take first 2 tablets together, then 1 every day until finished., Starting Sun 01/31/2023, Normal    dicyclomine (BENTYL) 20 MG tablet Take 1 tablet (20 mg total) by mouth 3 (three) times daily as needed for spasms., Starting Sun 01/31/2023, Normal    fluticasone (FLONASE) 50 MCG/ACT nasal spray Place 2 sprays into both nostrils daily for 14 days., Starting Sun 01/31/2023, Until Sun 02/14/2023, Normal    ondansetron (ZOFRAN-ODT) 4 MG disintegrating tablet Take 1 tablet (4 mg total) by mouth every 8 (eight) hours as needed., Starting Sun 01/31/2023, Normal    pantoprazole (PROTONIX) 40 MG tablet Take 1 tablet (40 mg total) by mouth daily., Starting Sun 01/31/2023, Until Sat 05/01/2023, Normal        Note:  This document was prepared using Dragon voice recognition software and may include unintentional dictation errors.  Nanda Quinton, MD, [facility]    Nykeria Mealing, Wonda Olds, MD  02/01/23 0714  

## 2023-01-31 NOTE — Discharge Instructions (Signed)
You were seen in the emergency room today with sinus pain and congestion.  I am treating you with antibiotic and nasal decongestant.  You can also use over-the-counter Mucinex to help break up your congestion.  We also discussed your abdominal pain and unintentional weight loss.  I have called in several medicines to help with this including an acid reducing medicine which you take daily (Protonix) and 2 medicines to take as needed.  Zofran you take for nausea and Bentyl you take for abdominal discomfort.   Your CT scan of the abdomen showed multiple lesions/masses in your liver.  These may be benign but given your pain, poor appetite, weight loss I do think that an abdominal MRI would be reasonable through your primary care doctor.  I would like for you to establish care with a primary doctor soon as possible and schedule this.

## 2023-02-06 ENCOUNTER — Other Ambulatory Visit: Payer: Self-pay

## 2023-02-06 ENCOUNTER — Encounter (HOSPITAL_BASED_OUTPATIENT_CLINIC_OR_DEPARTMENT_OTHER): Payer: Self-pay | Admitting: Emergency Medicine

## 2023-02-06 ENCOUNTER — Emergency Department (HOSPITAL_BASED_OUTPATIENT_CLINIC_OR_DEPARTMENT_OTHER)
Admission: EM | Admit: 2023-02-06 | Discharge: 2023-02-06 | Disposition: A | Discharge status: Home / Self Care | Payer: Self-pay | Attending: Emergency Medicine | Admitting: Emergency Medicine

## 2023-02-06 DIAGNOSIS — M545 Low back pain, unspecified: Secondary | ICD-10-CM

## 2023-02-06 MED ORDER — PREDNISONE 10 MG (21) PO TBPK: ORAL_TABLET | Freq: Every day | ORAL | 0 refills | Status: DC

## 2023-02-06 NOTE — ED Triage Notes (Signed)
Patient c/o low back pain onset yesterday after sitting something down. Patient has history of back issues.

## 2023-02-06 NOTE — ED Provider Notes (Signed)
[place] [facility]ENC[facility] AT [facility] Provider Note   CSN: [id number] Arrival date & time: 02/06/23  1017     History  Chief Complaint  Patient presents with   Back Pain    Jerome Williams is a 47 y.o. male.   Back Pain   47 year old male presents emergency department with complaints of low back pain.  Patient states that he was attempting to bend over to set down an object using a dolly yesterday when he noted acute onset low back pain.  Describes back pain as sharp and is worsened with movement.  States he has a history of low back pain and states this feels similar to "flareups" that he is experienced in the past.  Denies history of lumbar surgeries, fever, saddle anesthesia, bowel/bladder dysfunction, weakness or sensory deficits in lower extremities, history of IV drug use, known malignancy.  Denies radiation of pain down leg.  Has tried at home muscle relaxer which has helped.  States that he usually gets prednisone taper for similar prolapse in the past which have resolved symptoms.  Denies abdominal pain, nausea, vomiting, urinary symptoms, change in bowel habits.  No significant pertinent past medical history. Home Medications Prior to Admission medications   Medication Sig Start Date End Date Taking? Authorizing Provider  predniSONE (STERAPRED UNI-PAK 21 TAB) 10 MG (21) TBPK tablet Take by mouth daily. Take 6 tabs by mouth daily  for 2 days, then 5 tabs for 2 days, then 4 tabs for 2 days, then 3 tabs for 2 days, 2 tabs for 2 days, then 1 tab by mouth daily for 2 days 02/06/23  Yes Dion Saucier A, PA  azithromycin (ZITHROMAX) 250 MG tablet Take 1 tablet (250 mg total) by mouth daily. Take first 2 tablets together, then 1 every day until finished. 01/31/23   Long, Wonda Olds, MD  dicyclomine (BENTYL) 20 MG tablet Take 1 tablet (20 mg total) by mouth 3 (three) times daily as needed for spasms. 01/31/23   Long, Wonda Olds, MD  famotidine (PEPCID) 20 MG tablet Take 1  tablet (20 mg total) by mouth 2 (two) times daily. 10/23/14   Fredia Sorrow, MD  fluticasone (FLONASE) 50 MCG/ACT nasal spray Place 2 sprays into both nostrils daily for 14 days. 01/31/23 02/14/23  Long, Wonda Olds, MD  meloxicam (MOBIC) 15 MG tablet Take 1 tablet (15 mg total) by mouth daily. 08/31/18   Robyn Haber, MD  ondansetron (ZOFRAN-ODT) 4 MG disintegrating tablet Take 1 tablet (4 mg total) by mouth every 8 (eight) hours as needed. 01/31/23   Long, Wonda Olds, MD  pantoprazole (PROTONIX) 40 MG tablet Take 1 tablet (40 mg total) by mouth daily. 01/31/23 05/01/23  Long, Wonda Olds, MD      Allergies    Patient has no known allergies.    Review of Systems   Review of Systems  Musculoskeletal:  Positive for back pain.  All other systems reviewed and are negative.   Physical Exam Updated Vital Signs BP 113/71 (BP Location: Left Arm)   Pulse 60   Temp 97.8 F (36.6 C) (Oral)   Resp 16   Ht 5\' 9"  (1.753 m)   Wt 95.3 kg   SpO2 100%   BMI 31.03 kg/m  Physical Exam Vitals and nursing note reviewed.  Constitutional:      General: He is not in acute distress.    Appearance: He is well-developed.  HENT:     Head: Normocephalic and atraumatic.  Eyes:  Conjunctiva/sclera: Conjunctivae normal.  Cardiovascular:     Rate and Rhythm: Normal rate and regular rhythm.     Heart sounds: No murmur heard. Pulmonary:     Effort: Pulmonary effort is normal. No respiratory distress.     Breath sounds: Normal breath sounds.  Abdominal:     Palpations: Abdomen is soft.     Tenderness: There is no abdominal tenderness. There is no right CVA tenderness or left CVA tenderness.  Musculoskeletal:        General: No swelling.     Cervical back: Neck supple.     Comments: No midline tenderness of cervical, thoracic, lumbar spine with no a step-off or deformity noted.  Paraspinal tenderness noted in the right lumbar region without radiation.  Straight leg raise negative bilaterally.  Muscular  strength 5 out of 5 for knee flexion/extension, hip flexion/extension, ankle dorsi/plantarflexion bilaterally.  No sensory deficit along major nerve distributions of upper lower extremities.  DTR symmetric bilaterally.  Pedal pulses 2+ bilaterally.  Skin:    General: Skin is warm and dry.     Capillary Refill: Capillary refill takes less than 2 seconds.  Neurological:     Mental Status: He is alert.  Psychiatric:        Mood and Affect: Mood normal.     ED Results / Procedures / Treatments   Labs (all labs ordered are listed, but only abnormal results are displayed) Labs Reviewed - No data to display  EKG None  Radiology No results found.  Procedures Procedures    Medications Ordered in ED Medications - No data to display  ED Course/ Medical Decision Making/ A&P                             Medical Decision Making Risk Prescription drug management.   This patient presents to the ED for concern of pain, this involves an extensive number of treatment options, and is a complaint that carries with it a high risk of complications and morbidity.  The differential diagnosis includes acute fracture/dislocation, strain/sprain, cauda equina, spinal epidural abscess, nephrolithiasis, pyelonephritis   Co morbidities that complicate the patient evaluation  See HPI   Additional history obtained:  Additional history obtained from EMR External records from outside source obtained and reviewed including hospital records   Lab Tests:  N/a   Imaging Studies ordered:  N/a   Cardiac Monitoring: / EKG:  The patient was maintained on a cardiac monitor.  I personally viewed and interpreted the cardiac monitored which showed an underlying rhythm of: Sinus rhythm   Consultations Obtained:  N/a   Problem List / ED Course / Critical interventions / Medication management  Low back pain Reevaluation of the patient showed that the patient stayed the same I have reviewed  the patients home medicines and have made adjustments as needed   Social Determinants of Health:  Daily cigar use.  Denies illicit drug use.   Test / Admission - Considered:  Low back pain Vitals signs within normal range and stable throughout visit. 47 year old male presents emergency department with complaints of low back pain after lifting heavy object.  Patient's HPI without red flag signs for back pain with no acute findings on physical exam indicative of spinal cord compression.  Given no traumatic mechanism to lumbar spine, very low suspicion for acute fracture/dislocation.  Patient without intra-abdominal pathology, urinary symptoms, lack of CVA tenderness so low suspicion for pyelonephritis, nephrolithiasis.  Shared  decision-making was had with patient regarding obtaining lumbar x-rays but patient declined given that his symptoms are so similar in nature to prior episodes experienced of acute back pain.  Will prescribe prednisone taper given patient's significant improvement in the past.  Patient has at home nonsteroidals as well as muscle relaxers and states he does not need refill.  Patient given information for follow-up with spinal specialist given chronic back pain with intermittent acute worsening.  Treatment plan discussed at length with patient and he acknowledged understanding was agreeable to said plan. Worrisome signs and symptoms were discussed with the patient, and the patient acknowledged understanding to return to the ED if noticed. Patient was stable upon discharge.          Final Clinical Impression(s) / ED Diagnoses Final diagnoses:  Acute right-sided low back pain without sciatica    Rx / DC Orders ED Discharge Orders          Ordered    predniSONE (STERAPRED UNI-PAK 21 TAB) 10 MG (21) TBPK tablet  Daily        02/06/23 1033              Wilnette Kales, [place] 02/06/23 1046    Ezequiel Essex, MD 02/06/23 1114

## 2023-02-06 NOTE — Discharge Instructions (Addendum)
Note the visit emergency department today was overall reassuring.  As discussed, we will send in steroid taper to take for your back pain.  You may continue to take your meloxicam/Mobic at home as prescribed.  Recommend follow-up with spinal specialist if symptoms for reevaluation; see information attached to call to set up an appointment.  Please do not hesitate to return to emergency department for worrisome signs and symptoms we discussed become apparent.

## 2023-02-16 NOTE — Progress Notes (Signed)
   Subjective:    Patient ID: Jerome Williams, male    DOB: 27-Jun-1976, 47 y.o.   MRN: [id number]   CC: New Patient  Weight loss Reports losing around 30 pounds in about 3 months unintentionally. Previously around 226 and dropped down to 197. Seen in the ED for the same issue on 01/31/2023. States he did not feel like eating previously which has now resolved. Reports occasional episodes of nausea and abdominal pain previously. Endorses some night sweats. Denies fever or vomiting. Possible dark stool a few weeks back. Possibly small diameter stools than usual. Denies immediate family hx cancer. States he felt ill during this time which could have contributing to not eating very much.  HPI: PMHx: Past Medical History:  Diagnosis Date   HTN (hypertension)      Surgical Hx: Past Surgical History:  Procedure Laterality Date   MOUTH SURGERY     x 2   SKIN GRAFT     Injured right hand resulting in surgery and skin graft     Family Hx: History reviewed. No pertinent family history.   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 Neck: Supple, non-tender Cardiovascular: RRR without murmur Respiratory: CTAB. Normal WOB on RA Gastrointestinal: Soft, non-tender, non-distended MSK: No peripheral edema Derm: Warm,  dry, no rashes noted Neuro: CN intact. Motor and sensation intact globally Psych: Cooperative, pleasant    Assessment & Plan:   Healthcare maintenance Has not seen a PCP previously. Currently without insurance but thinks he may qualify through work and will speak with employer. Discussed application for Medicaid if not available. Patient deferred all lab work today due to cost. -Recommend lipid panel, A1c, CMP when insured given elevated glucose and LFTs inpatient -Due for TDaP  Weight loss Subjective unintentional 30 pound weight loss over a couple months. Per chart review weight is stable from previous years. Endorses symptom resolution and return to normal eating patterns so possibly in the setting of acute illness. Recommend colonoscopy given due for screening anyway and possible stool changes. Declined lab work given uninsured. -Referred to GI for colonoscopy (has never had screening) -Recommend TSH and HIV testing when insured -PSA at patient request when insured  No follow-ups on file.  Elberta Fortis, DO

## 2023-02-18 ENCOUNTER — Ambulatory Visit (INDEPENDENT_AMBULATORY_CARE_PROVIDER_SITE_OTHER): Payer: Self-pay | Admitting: Family Medicine

## 2023-02-18 ENCOUNTER — Encounter: Payer: Self-pay | Admitting: Family Medicine

## 2023-02-18 VITALS — BP 129/83 | HR 80 | Ht 69.0 in | Wt 206.6 lb

## 2023-02-18 DIAGNOSIS — R7989 Other specified abnormal findings of blood chemistry: Secondary | ICD-10-CM

## 2023-02-18 DIAGNOSIS — Z1211 Encounter for screening for malignant neoplasm of colon: Secondary | ICD-10-CM

## 2023-02-18 DIAGNOSIS — Z1159 Encounter for screening for other viral diseases: Secondary | ICD-10-CM

## 2023-02-18 DIAGNOSIS — R7309 Other abnormal glucose: Secondary | ICD-10-CM

## 2023-02-18 DIAGNOSIS — R634 Abnormal weight loss: Secondary | ICD-10-CM

## 2023-02-18 DIAGNOSIS — Z0000 Encounter for general adult medical examination without abnormal findings: Secondary | ICD-10-CM

## 2023-02-18 NOTE — Patient Instructions (Addendum)
It was wonderful to see you today! Thank you for choosing [facility].   Please bring ALL of your medications with you to every visit.   Today we talked about:  We are checking some labs today and I will follow up with you about those results. I am also sending in a referral to the GI doctor to get a colonoscopy.  Please follow up pending lab work   We are checking some labs today. If they are abnormal, I will call you. If they are normal, I will send you a MyChart message (if it is active) or a letter in the mail. If you do not hear about your labs in the next 2 weeks, please call the office.  Call the clinic at [phone] if your symptoms worsen or you have any concerns.  Please be sure to schedule follow up at the front desk before you leave today.   Elberta Fortis, DO Family Medicine

## 2023-02-19 DIAGNOSIS — R634 Abnormal weight loss: Secondary | ICD-10-CM | POA: Insufficient documentation

## 2023-02-19 DIAGNOSIS — Z0000 Encounter for general adult medical examination without abnormal findings: Secondary | ICD-10-CM | POA: Insufficient documentation

## 2023-02-19 NOTE — Assessment & Plan Note (Addendum)
Has not seen a PCP previously. Currently without insurance but thinks he may qualify through work and will speak with employer. Discussed application for Medicaid if not available. Patient deferred all lab work today due to cost. -Recommend lipid panel, A1c, CMP when insured given elevated glucose and LFTs inpatient -Due for TDaP

## 2023-02-19 NOTE — Assessment & Plan Note (Addendum)
Subjective unintentional 30 pound weight loss over a couple months. Per chart review weight is stable from previous years. Endorses symptom resolution and return to normal eating patterns so possibly in the setting of acute illness. Recommend colonoscopy given due for screening anyway and possible stool changes. Declined lab work given uninsured. -Referred to GI for colonoscopy (has never had screening) -Recommend TSH and HIV testing when insured -PSA at patient request when insured

## 2023-03-02 ENCOUNTER — Encounter: Payer: Self-pay | Admitting: Family Medicine

## 2023-09-04 IMAGING — DX DG CHEST 1V
1 series · 1 of 1 positions shown · non-contrast
Comparison: Chest x-ray 10/10/2021.

CLINICAL DATA: 45-year-old male under evaluation for annual
physical examination.

EXAM:
CHEST  1 VIEW

[chest pa]
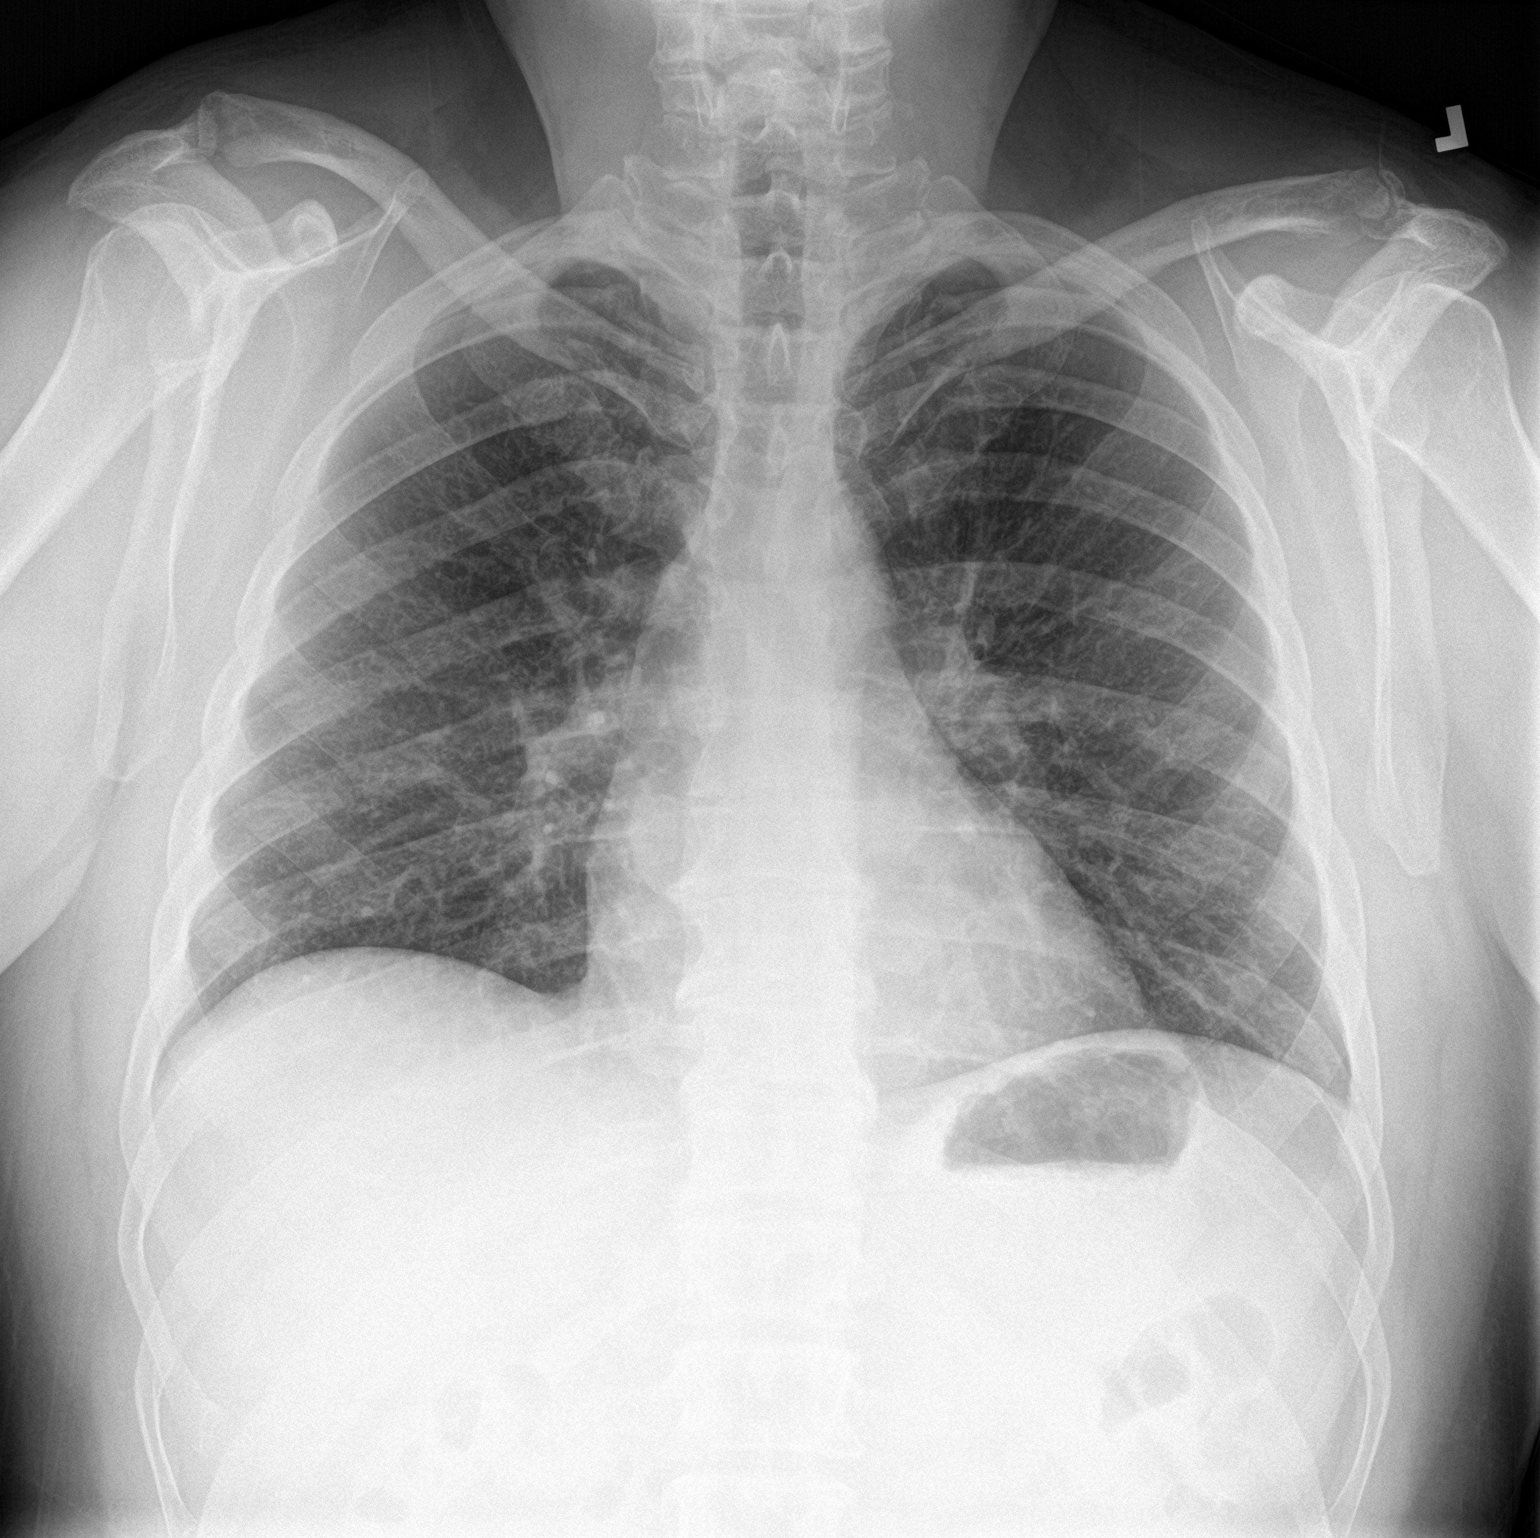

[1 of 1 positions shown; findings below may reference images not displayed]

FINDINGS: Lung volumes are low. Mild diffuse peribronchial cuffing and
interstitial prominence. No consolidative airspace disease. No
pleural effusions. No pneumothorax. No pulmonary nodule or mass
noted. Pulmonary vasculature and the cardiomediastinal silhouette
are within normal limits.
IMPRESSION: 1. Mild diffuse peribronchial cuffing and interstitial prominence,
similar to prior studies, suggesting chronic bronchitis.

## 2023-12-29 NOTE — Progress Notes (Signed)
 Jerome Williams is a 48 y.o. male who presents for a Biometric Screening.  He admits to fasting for 12 hours.    BMI =There is no height or weight on file to calculate BMI.    Waist Circumference =      Lab Results  Component Value Date   Cholesterol,Total 164 12/24/2023   Triglycerides 87 12/24/2023   HDL Cholesterol 39 (L) 12/24/2023   GLUCOSE 102 (H) 12/24/2023   LDL Cholesterol,Calc 107 (H) 12/24/2023     No visits with results within 1 Day(s) from this visit.  Latest known visit with results is:  Lab on 12/24/2023  Component Date Value Ref Range Status  . Hemoglobin A1c 12/24/2023 5.4  <5.7 % of total Hgb Final   Comment: For the purpose of screening for the presence of diabetes:   <5.7%       Consistent with the absence of diabetes 5.7-6.4%    Consistent with increased risk for diabetes             (prediabetes) > or =6.5%  Consistent with diabetes   This assay result is consistent with a decreased risk of diabetes.   Currently, no consensus exists regarding use of hemoglobin A1c for diagnosis of diabetes in children.   According to American Diabetes Association (ADA) guidelines, hemoglobin A1c <7.0% represents optimal control in non-pregnant diabetic patients. Different metrics may apply to specific patient populations.  Standards of Medical Care in Diabetes(ADA).      SABRA GLUCOSE 12/24/2023 102 (H)  65 - 99 mg/dL Final   Comment:              Fasting reference interval   For someone without known diabetes, a glucose value between 100 and 125 mg/dL is consistent with prediabetes and should be confirmed with a follow-up test.     . Urea Nitrogen 12/24/2023 14  7 - 25 mg/dL Final  . Creatinine 97/85/7974 0.94  0.60 - 1.29 mg/dL Final  . EGFR 97/85/7974 99  > OR = 60 mL/min/1.40m2 Final  . BUN/Creatinine Ratio 12/24/2023 SEE NOTE:  6 - 22 (calc) Final   Comment:    Not Reported: BUN and Creatinine are within    reference range.        . Sodium 12/24/2023  137  135 - 146 mmol/L Final  . Potassium 12/24/2023 4.2  3.5 - 5.3 mmol/L Final  . Chloride 12/24/2023 104  98 - 110 mmol/L Final  . Carbon Dioxide 12/24/2023 23  20 - 32 mmol/L Final  . Calcium,Plasma 12/24/2023 9.2  8.6 - 10.3 mg/dL Final  . Protein,Total 12/24/2023 8.0  6.1 - 8.1 g/dL Final  . Albumin 97/85/7974 4.4  3.6 - 5.1 g/dL Final  . Globulin,Calculated 12/24/2023 3.6  1.9 - 3.7 g/dL (calc) Final  . A/G Ratio 12/24/2023 1.2  1.0 - 2.5 (calc) Final  . Bilirubin,Total 12/24/2023 0.7  0.2 - 1.2 mg/dL Final  . Alkaline Phosphatase 12/24/2023 96  36 - 130 U/L Final  . AST 12/24/2023 21  10 - 40 U/L Final  . ALT 12/24/2023 26  9 - 46 U/L Final  . Cholesterol,Total 12/24/2023 164  <200 mg/dL Final  . HDL Cholesterol 12/24/2023 39 (L)  > OR = 40 mg/dL Final  . Triglycerides 12/24/2023 87  <150 mg/dL Final  . LDL Cholesterol,Calc 12/24/2023 107 (H)  mg/dL (calc) Final   Comment: Reference range: <100   Desirable range <100 mg/dL for primary prevention;   <70 mg/dL for patients  with CHD or diabetic patients  with > or = 2 CHD risk factors.   LDL-C is now calculated using the Martin-Hopkins  calculation, which is a validated novel method providing  better accuracy than the Friedewald equation in the  estimation of LDL-C.  Gladis APPLETHWAITE et al. SANDREA. 7986;689(80): 2061-2068  (http://education.[employer].com/faq/FAQ164)   . SQ CHOL/HDLC RATIO 12/24/2023 4.2  <5.0 (calc) Final  . Non-HDL Cholesterol 12/24/2023 125  <130 mg/dL (calc) Final   Comment: For patients with diabetes plus 1 major ASCVD risk  factor, treating to a non-HDL-C goal of <100 mg/dL  (LDL-C of <29 mg/dL) is considered a therapeutic  option.      Patient given education on no further coaching needed.  Referred to Health coaching, (or others) as appropriate.   No orders of the defined types were placed in this encounter.

## 2024-01-02 NOTE — Progress Notes (Signed)
I have reviewed the documented history, ROS, physical exam, and decision making and attest to this patient's chart.

## 2024-03-02 ENCOUNTER — Other Ambulatory Visit: Payer: Self-pay | Admitting: Student

## 2024-03-02 ENCOUNTER — Ambulatory Visit (INDEPENDENT_AMBULATORY_CARE_PROVIDER_SITE_OTHER): Payer: Self-pay

## 2024-03-02 ENCOUNTER — Ambulatory Visit: Payer: Self-pay

## 2024-03-02 DIAGNOSIS — Z021 Encounter for pre-employment examination: Secondary | ICD-10-CM

## 2024-09-28 NOTE — Progress Notes (Signed)
 Plastic and Reconstructive Surgery Clinic Note  Chief Complaint: No chief complaint on file.    Referred by: Self  History of Present Illness:  Subjective  Jerome Williams is a 48 y.o. male who presents for bilateral hand pain present for years.  He notes the pain located around the MCP joints bilaterally, worse with use of the hands and improved with rest. He notes intermittent numbness or tingling of bilateral hands when he is driving but notes the numbness is not associated with his pain.  He denies recent trauma or inciting event     Past Medical History:  Medical History[1]  Problem List Problem List[2]  Past Surgical History: Surgical History[3]  Social History: Social History   Tobacco Use  . Smoking status: Never  . Smokeless tobacco: Never  Substance Use Topics  . Alcohol use: No    Family History: family history includes COPD in his paternal uncle; Cancer in his maternal grandfather; Hypertension in his father; Stroke in his mother.  Allergies: Allergies[4]  Medications: currently has no medications in their medication list.  Review of Systems: A 14 point review of systems was obtained and negative unless otherwise stated in the HPI  Physical Examination:  temporal temperature is 97.3 F (36.3 C). His blood pressure is 132/88 and his pulse is 59.  There is no height or weight on file to calculate BMI.  Gen: well appearing male in no acute distress Psych: alert, normal mood/affect Neuro: follows commands, CN VII function symmetric Eyes: pupils equal, round ENT:  mucous membranes moist and pink Resp: breathing comfortably on room air, non-labored, no distress CV: no clubbing, no cyanosis Skin: warm, no systemic rashes Extremity: TTP overlying bilateral MCP joints, dorsal aspect of bilateral metacarpals, volar aspect of metacarpals, dorsal/volar proximal phalanges ROM: active flexion/extension intact Sensation: LT sensation present M/R/U Vascular:  Brisk capillary refill     Imaging 3 Views Bilateral Hands 09/28/24  No acute fracutre, joint spaces maintained, scattered periarticular osteophyte formation of MCP joints   Assessment and Plan:  Problem List Items Addressed This Visit     Pain in both hands - Primary   Relevant Orders   XR Hand Minimum 3 Views Right   XR Hand Minimum 3 Views Left   48yo M with bilateral hand pain -The clinical course and relevant anatomy pertaining to the patients diagnosis were discussed in detail, as well as potential treatment options and the associated risks/benefits of each. We discussed that his global hand pain and tenderness to palpation in every area palpated does not fit a definitive diagnosis, but given that he notes his pain/tenderness is centered around MCP joints that the problem could be rheumatologic in nature.  I have placed a referral to rheumatology and additionally prescribed a medrol dosepak.  I instructed the patient to hold off on the steroid pack if able to get in with Rheumatology relatively soon.     Jerome JULIANNA Hock, MD  Plastic & Reconstructive Surgery Hand & Upper Extremity Surgery [facility] 09/28/2024 10:59 AM         [1] No past medical history on file. [2] Patient Active Problem List Diagnosis  . Lateral epicondylitis of left elbow  . Degenerative disc disease, lumbar  . Right lateral epicondylitis  . Obesity (BMI 30-39.9)  . Pain in both hands  [3] No past surgical history on file. [4] No Known Allergies

## 2024-10-11 ENCOUNTER — Encounter (HOSPITAL_BASED_OUTPATIENT_CLINIC_OR_DEPARTMENT_OTHER): Payer: Self-pay

## 2024-10-11 ENCOUNTER — Other Ambulatory Visit: Payer: Self-pay

## 2024-10-11 ENCOUNTER — Emergency Department (HOSPITAL_BASED_OUTPATIENT_CLINIC_OR_DEPARTMENT_OTHER)
Admission: EM | Admit: 2024-10-11 | Discharge: 2024-10-11 | Disposition: A | Discharge status: Home / Self Care | Attending: Emergency Medicine | Admitting: Emergency Medicine

## 2024-10-11 DIAGNOSIS — M79641 Pain in right hand: Secondary | ICD-10-CM | POA: Insufficient documentation

## 2024-10-11 DIAGNOSIS — J45909 Unspecified asthma, uncomplicated: Secondary | ICD-10-CM | POA: Insufficient documentation

## 2024-10-11 DIAGNOSIS — I10 Essential (primary) hypertension: Secondary | ICD-10-CM | POA: Diagnosis not present

## 2024-10-11 DIAGNOSIS — M79642 Pain in left hand: Secondary | ICD-10-CM | POA: Diagnosis not present

## 2024-10-11 MED ORDER — HYDROCODONE-ACETAMINOPHEN 5-325 MG PO TABS: 1.0000 | ORAL_TABLET | ORAL | 0 refills | Status: AC | PRN
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
## 2024-10-11 NOTE — ED Notes (Signed)
 Pt placed in gown.

## 2024-10-11 NOTE — ED Provider Notes (Signed)
 [facility] Provider Note   CSN: [id number] Arrival date & time: 10/11/24  9058     Patient presents with: Hand Pain   Jerome Williams is a 48 y.o. male.   48 y.o male with a PMH of HTN, Asthma, presents to the ED with a chief complaint of bilateral hand pain x few weeks.  Patient reports he was seen by hand specialist, they thought this was likely a rheumatological problem, therefore they referred him to rheumatology however this appointment is not until February.  He has taken some steroids that did help with some of the swelling however pain persist specially with any type of hand extension, his fingers do appear somewhat contracted.  He was evaluated by PCP due to a left neck cyst, and referred to the emergency department for MRI of his neck.  He reports no neck pain on today's visit, no fever, no prior injury.  The history is provided by the patient.  Hand Pain       Prior to Admission medications   Medication Sig Start Date End Date Taking? Authorizing Provider  HYDROcodone -acetaminophen  (NORCO/VICODIN) 5-325 MG tablet Take 1 tablet by mouth every 4 (four) hours as needed for up to 3 days. 10/11/24 10/14/24 Yes Samaira Holzworth, PA-C  famotidine  (PEPCID ) 20 MG tablet Take 1 tablet (20 mg total) by mouth 2 (two) times daily. 10/23/14   Zackowski, Scott, MD  fluticasone  (FLONASE ) 50 MCG/ACT nasal spray Place 2 sprays into both nostrils daily for 14 days. 01/31/23 02/14/23  Long, Fonda MATSU, MD  pantoprazole  (PROTONIX ) 40 MG tablet Take 1 tablet (40 mg total) by mouth daily. 01/31/23 05/01/23  Long, Joshua G, MD    Allergies: Patient has no known allergies.    Review of Systems  Constitutional:  Negative for fever.  Musculoskeletal:  Positive for arthralgias.    Updated Vital Signs BP 127/87   Pulse 71   Temp 97.9 F (36.6 C) (Oral)   Resp 18   SpO2 98%   Physical Exam Vitals and nursing note reviewed.  Constitutional:       Appearance: Normal appearance.  HENT:     Head: Normocephalic and atraumatic.     Mouth/Throat:     Mouth: Mucous membranes are moist.  Cardiovascular:     Rate and Rhythm: Normal rate.  Pulmonary:     Effort: Pulmonary effort is normal.  Abdominal:     General: Abdomen is flat.  Musculoskeletal:     Right hand: Tenderness present. Decreased range of motion. Normal strength. Normal sensation. Normal capillary refill. Normal pulse.     Left hand: Swelling and tenderness present. Decreased range of motion. Normal strength. Normal sensation. Normal capillary refill. Normal pulse.     Cervical back: Normal range of motion and neck supple.     Comments: 2+ radial pulses bilaterally.  Decrease in range of motion with digits extension.  Fingers appear contracted but with good capillary refill.  Skin:    General: Skin is warm and dry.  Neurological:     Mental Status: He is alert and oriented to person, place, and time.     (all labs ordered are listed, but only abnormal results are displayed) Labs Reviewed - No data to display  EKG: None  Radiology: No results found.   Procedures   Medications Ordered in the ED - No data to display  Medical Decision Making Risk Prescription drug management.     Patient presented to the ED with a chief complaint of bilateral hand pain that is been ongoing for several weeks.  He was evaluated by hand specialist however he was then sent to rheumatology.  He is here because the pain is not improving although the swelling did have some improvement.  On first evaluation he is neurovascularly intact.  There is some concern for scleroderma with the fact that he is able to flex but there is flexor tightness.   Exam is overall benign, I do feel that patient warrants some rheumatological follow-up.  I do not suspect this is radiculopathy, he is not having any neck pain and has full range of motion of his neck and there  is no numbness or tingling aside from the swelling and pain with any type of flexion.  He will call rheumatologist office today.  Given a short prescription of pain control.  Patient is hemodynamically stable for discharge.  Portions of this note were generated with Scientist, clinical (histocompatibility and immunogenetics). Dictation errors may occur despite best attempts at proofreading.  Final diagnoses:  Bilateral hand pain    ED Discharge Orders          Ordered    HYDROcodone -acetaminophen  (NORCO/VICODIN) 5-325 MG tablet  Every 4 hours PRN        10/11/24 1150               Ameenah Prosser, PA-C 10/11/24 1151    Pamella Sharper A, DO 10/12/24 1539

## 2024-10-11 NOTE — Discharge Instructions (Addendum)
 Were given a short course of pain medication, please take this only for severe pain.  Please call the rheumatologist office in order to obtain a closer appointment.

## 2024-10-11 NOTE — ED Triage Notes (Signed)
 Reports BIL hand and finger pain and swelling for 2 months. No recent injury. Told by PCP it could be from neck pain.
